# Patient Record
Sex: Male | Born: 2000 | Race: White | Hispanic: No | Marital: Single | Smoking: Former smoker
Health system: Southern US, Academic
[De-identification: ages and names within clinical notes are randomized; demographics above are authoritative.]

## PROBLEM LIST (undated history)

## (undated) HISTORY — PX: HX HERNIA REPAIR: SHX51

## (undated) HISTORY — PX: HX ACL RECONSTRUCTION: SHX115

## (undated) NOTE — Progress Notes (Signed)
 Formatting of this note might be different from the original.  Subjective   Patient ID: Jason Brown is a 24 y.o. male presenting to the Urgent Care with a chief complaint of Groin Pain (Started Tuesday, recent HSV flare-up; worried about infection).    Patient states he thinks there is infection behind the scrotum, states it is swollen and painful. Pain is radiating into the groin. Discussed care with patient, he needs to have an ultrasound, I can palpate the area, but cannot accurately diagnose an infection behind the scrotum, he requires an ultrasound and needs to go to the ER    History provided by:  Patient  Groin Pain    Objective   BP (!) 138/91 (BP Location: Right arm, Patient Position: Sitting, BP Cuff Size: Adult)   Pulse 86   Temp 36.7 C (98 F) (Oral)   Resp 18   Ht 1.778 m (5' 10)   Wt 70.3 kg (155 lb)   SpO2 99%   BMI 22.24 kg/m     Physical Exam      Assessment & Plan    Assessment & Plan        In-House Lab Results:   No results found for this or any previous visit.     In-House Imaging Reads:        Procedure Documentation:  Procedures     ED Course & MDM   MDM - Medical Decision Making: Patient declined visit.   Electronically signed by Dufm Stephane Lunger, CRNP at 04/11/2024  6:59 PM EDT

## (undated) NOTE — Progress Notes (Signed)
 Formatting of this note might be different from the original.  [employer] sent an email today saying they did issue the refund but they were unable to refund the card. They are sending a check. Attempted to call pt to inform, but no answer. Alj rma  Electronically signed by Jeoffrey Schimke, MA at 04/25/2024  1:45 PM EDT

---
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
## 2022-10-17 ENCOUNTER — Other Ambulatory Visit: Payer: Self-pay

## 2022-10-17 ENCOUNTER — Emergency Department (HOSPITAL_BASED_OUTPATIENT_CLINIC_OR_DEPARTMENT_OTHER): Payer: Worker's Comp, Other unspecified

## 2022-10-17 ENCOUNTER — Emergency Department
Admission: EM | Admit: 2022-10-17 | Discharge: 2022-10-17 | Disposition: A | Discharge status: Home / Self Care | Payer: Worker's Comp, Other unspecified | Attending: Emergency Medicine | Admitting: Emergency Medicine

## 2022-10-17 ENCOUNTER — Encounter (HOSPITAL_BASED_OUTPATIENT_CLINIC_OR_DEPARTMENT_OTHER): Payer: Self-pay

## 2022-10-17 DIAGNOSIS — S80211A Abrasion, right knee, initial encounter: Secondary | ICD-10-CM | POA: Insufficient documentation

## 2022-10-17 DIAGNOSIS — Y92149 Unspecified place in prison as the place of occurrence of the external cause: Secondary | ICD-10-CM

## 2022-10-17 DIAGNOSIS — S8390XA Sprain of unspecified site of unspecified knee, initial encounter: Secondary | ICD-10-CM | POA: Insufficient documentation

## 2022-10-17 DIAGNOSIS — Y9372 Activity, wrestling: Secondary | ICD-10-CM

## 2022-10-17 DIAGNOSIS — S80219A Abrasion, unspecified knee, initial encounter: Secondary | ICD-10-CM

## 2022-10-17 DIAGNOSIS — Y990 Civilian activity done for income or pay: Secondary | ICD-10-CM | POA: Insufficient documentation

## 2022-10-17 DIAGNOSIS — S8000XA Contusion of unspecified knee, initial encounter: Secondary | ICD-10-CM

## 2022-10-17 DIAGNOSIS — S0990XA Unspecified injury of head, initial encounter: Secondary | ICD-10-CM | POA: Insufficient documentation

## 2022-10-17 DIAGNOSIS — S8001XA Contusion of right knee, initial encounter: Secondary | ICD-10-CM | POA: Insufficient documentation

## 2022-10-17 DIAGNOSIS — S8391XA Sprain of unspecified site of right knee, initial encounter: Secondary | ICD-10-CM

## 2022-10-17 MED ORDER — IBUPROFEN 800 MG TABLET
800.0000 mg | ORAL_TABLET | Freq: Four times a day (QID) | ORAL | 0 refills | Status: AC | PRN
Start: 2022-10-17 — End: ?

## 2022-10-17 MED ORDER — BACITRACIN 500 UNIT/G OINTMENT TUBE
TOPICAL_OINTMENT | CUTANEOUS | Status: AC
Start: 2022-10-17 — End: 2022-10-17
  Filled 2022-10-17: qty 28.4

## 2022-10-17 MED ORDER — BACITRACIN 500 UNIT/G OINTMENT TUBE
TOPICAL_OINTMENT | CUTANEOUS | Status: AC
Start: 2022-10-17 — End: 2022-10-17

## 2022-10-17 MED ORDER — IBUPROFEN 800 MG TABLET
800.0000 mg | ORAL_TABLET | ORAL | Status: AC
Start: 2022-10-17 — End: 2022-10-17
  Administered 2022-10-17: 800 mg via ORAL

## 2022-10-17 MED ORDER — IBUPROFEN 800 MG TABLET
ORAL_TABLET | ORAL | Status: AC
Start: 2022-10-17 — End: 2022-10-17
  Filled 2022-10-17: qty 1
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
## 2022-10-17 NOTE — ED Nurses Note (Signed)
Right knee cleansed with hibiclens, applied adaptic, gauze to stated area. Patient tolerated well.
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
## 2022-10-17 NOTE — ED Nurses Note (Signed)
Patient discharged home with co worker. Reviewed instructions and prescriptions with patient. Questions sufficiently answered as needed. Patient verbalized understanding of instructions and prescriptions. Patient encouraged to follow up with PCP as indicated. In the event of an emergency, patient instructed to call 911 or go to the nearest emergency room. Patient ambulated off the unit.

## 2022-10-17 NOTE — ED Nurses Note (Signed)
Patient resting in bed, resp even and unlabored on RA. Denies any complaints of pain. No neurologic deficits noted. No acute distress noted.

## 2022-10-17 NOTE — ED Nurses Note (Signed)
Knee immobilizer applied right leg. Patient tolerated well.

## 2022-12-04 IMAGING — MR MRI KNEE RT W/O CONTRAST
5 series · 40 of 40 positions shown · non-contrast
Comparison: September 15, 2020

﻿EXAM:  49436   MRI KNEE RT W/O CONTRAST
INDICATION: Right knee pain, internal derangement.
TECHNIQUE: Noncontrast multiplanar, multisequence MRI was performed.

[Series 5: PD fat-sat · axial · right · 4.0mm · 0.53mm/px · z∈[-72,+59]mm · 8 of 30 slices shown (1 of 3)]
[im 1/30]
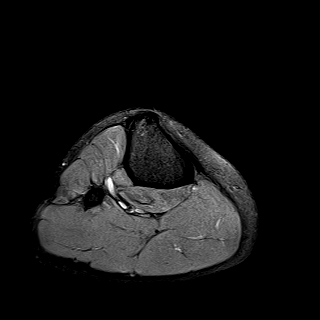
[im 5/30]
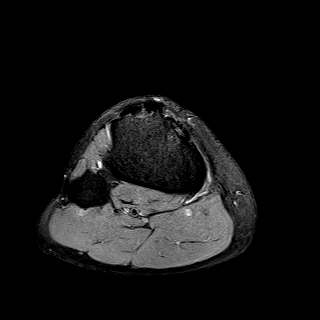
[im 9/30]
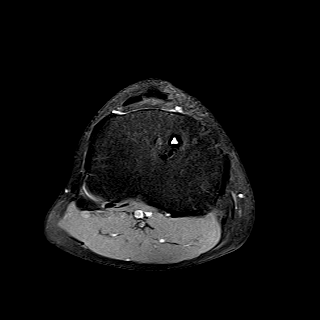
[im 13/30]
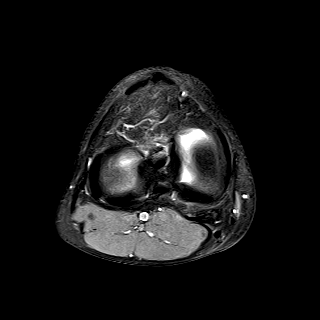
[im 17/30]
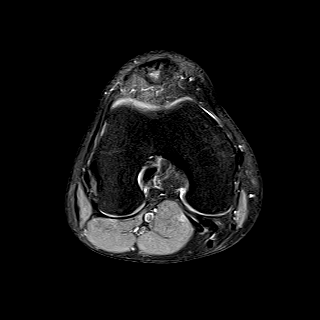
[im 21/30]
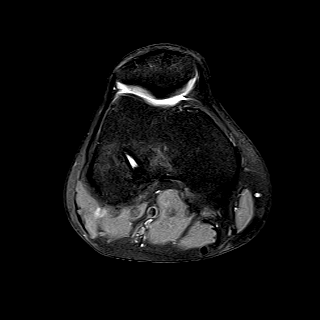
[im 25/30]
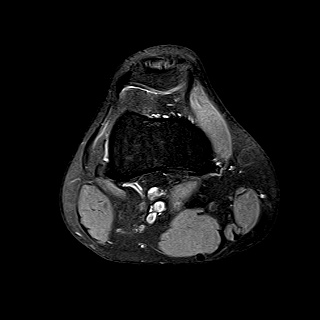
[im 30/30]
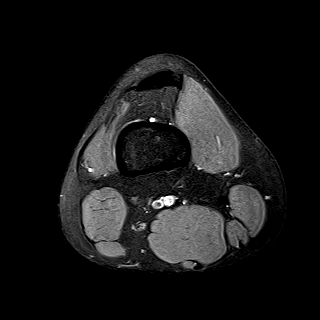

[Series 6: PD fat-sat · sagittal · right · 3.0mm · 0.47mm/px · 9 of 30 slices shown (2 of 3)]
[im 1/30]
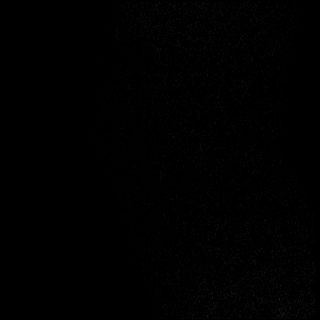
[im 4/30]
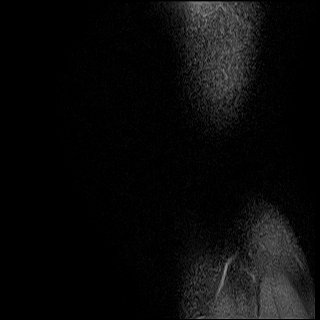
[im 8/30]
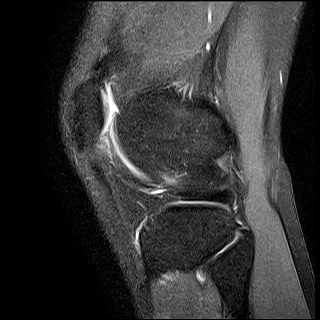
[im 11/30]
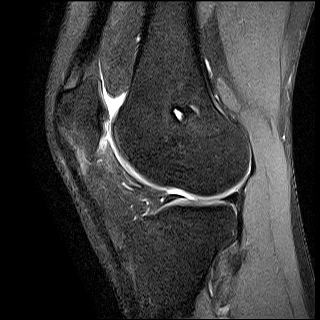
[im 15/30]
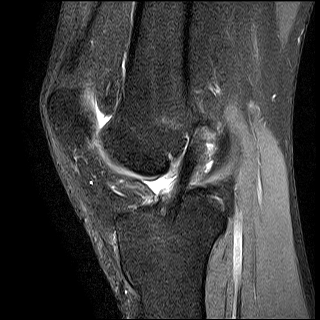
[im 19/30]
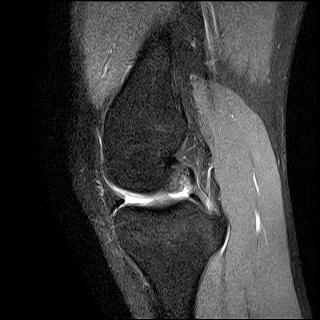
[im 22/30]
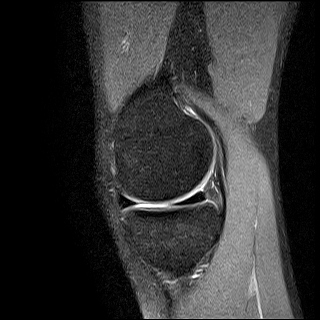
[im 26/30]
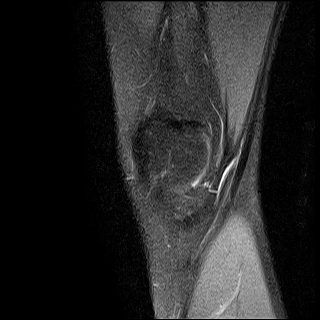
[im 30/30]
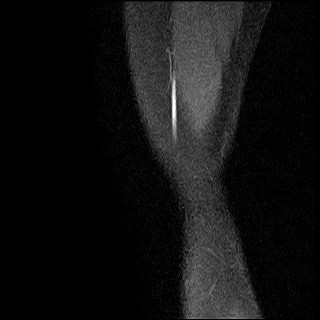

[Series 7: T1 · sagittal · right · 3.0mm · 0.39mm/px · 9 of 30 slices shown]
[im 1/30]
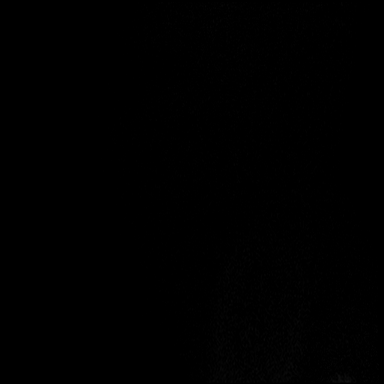
[im 4/30]
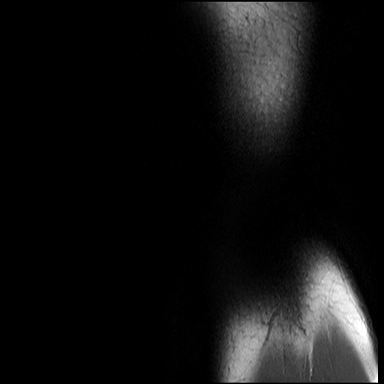
[im 8/30]
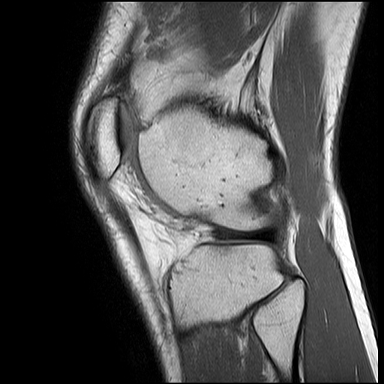
[im 11/30]
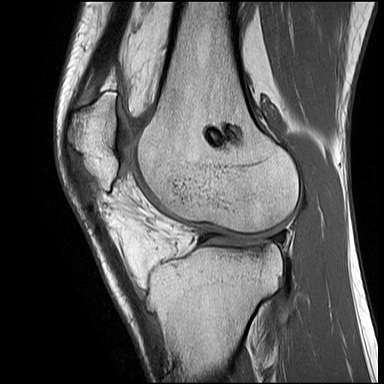
[im 15/30]
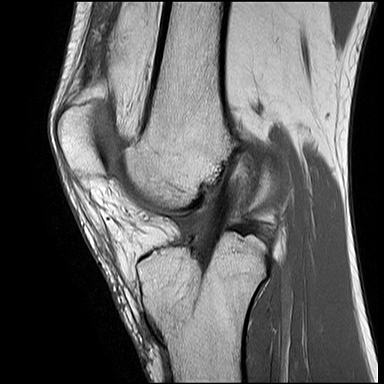
[im 19/30]
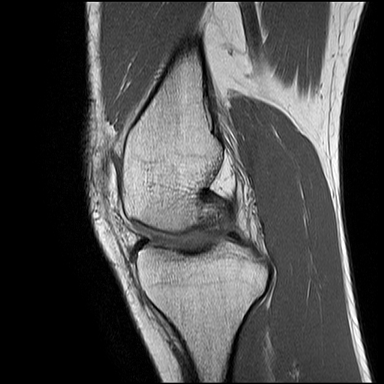
[im 22/30]
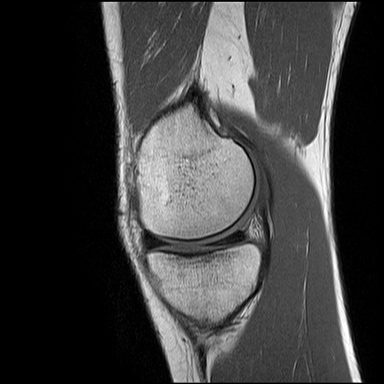
[im 26/30]
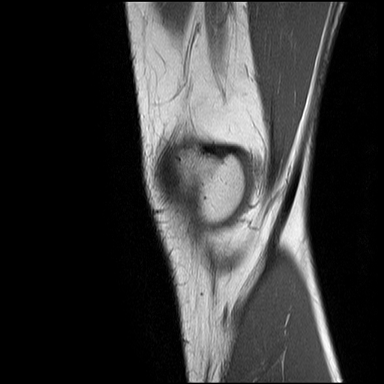
[im 30/30]
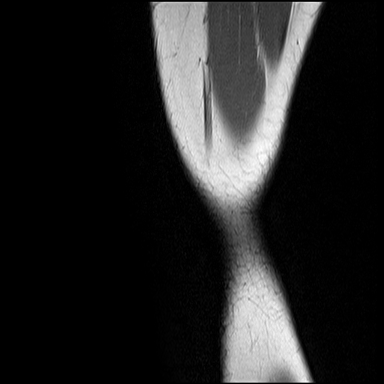

[Series 8: STIR · coronal · right · 3.5mm · 0.47mm/px · 7 of 23 slices shown]
[im 1/23]
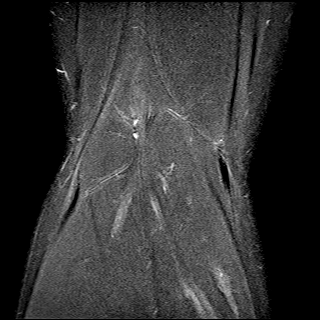
[im 4/23]
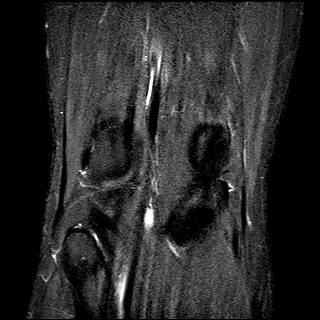
[im 8/23]
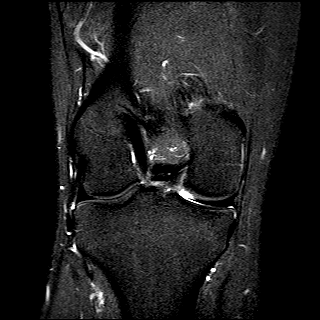
[im 12/23]
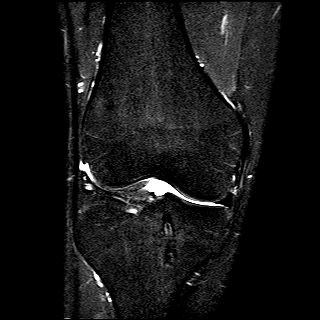
[im 15/23]
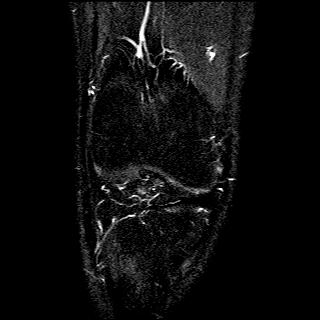
[im 19/23]
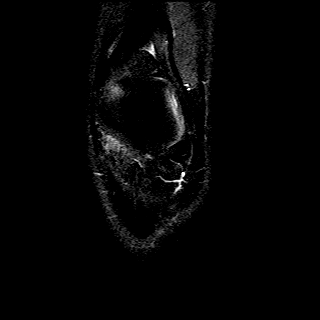
[im 23/23]
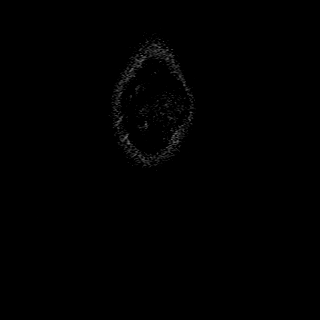

[Series 9: PD fat-sat · coronal · right · 3.5mm · 0.47mm/px · 7 of 23 slices shown (3 of 3)]
[im 1/23]
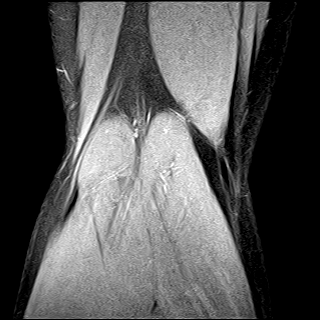
[im 4/23]
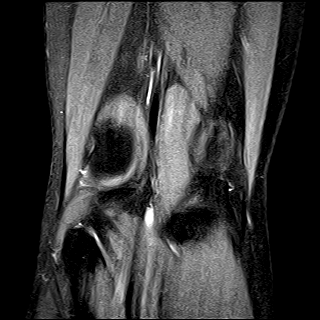
[im 8/23]
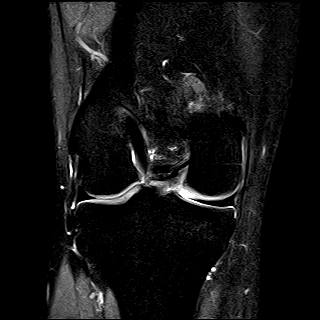
[im 12/23]
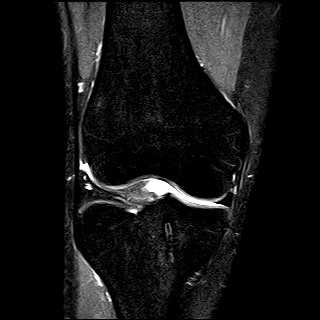
[im 15/23]
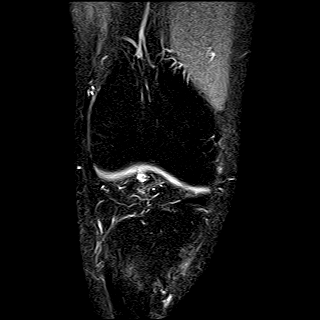
[im 19/23]
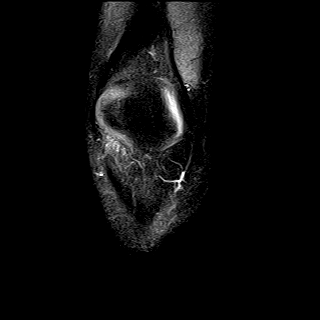
[im 23/23]
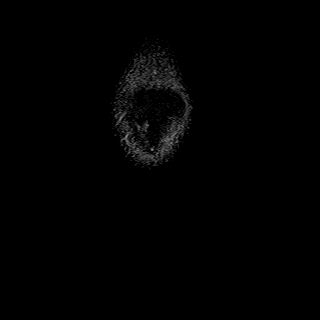

[40 of 40 positions shown; findings below may reference images not displayed]

FINDINGS: There is a small joint effusion.  Postsurgical change is noted with anterior cruciate ligament repair.  The tendon graft is intact with a taught appearance.  

There is thickening with intermediate signal intensity in the proximal patellar tendon compatible with tendinosis.  This finding is new since September 15, 2020.  Other than that, this finding is of indeterminate age.  

There is mild, grade 1 chondromalacia patella that appears unchanged.  

The posterior cruciate ligament, menisci, collateral ligaments, and quadriceps tendon appear intact.  There is no fracture, dislocation, or bone contusion.
IMPRESSION: 1. Patellar tendinosis. 

2. Intact ACL graft. 

3. No internal derangement is seen.

## 2023-03-08 ENCOUNTER — Encounter (HOSPITAL_BASED_OUTPATIENT_CLINIC_OR_DEPARTMENT_OTHER): Payer: Self-pay

## 2023-03-08 ENCOUNTER — Emergency Department
Admission: EM | Admit: 2023-03-08 | Discharge: 2023-03-08 | Disposition: A | Discharge status: Home / Self Care | Payer: Worker's Compensation | Attending: Emergency Medicine | Admitting: Emergency Medicine

## 2023-03-08 ENCOUNTER — Other Ambulatory Visit: Payer: Self-pay

## 2023-03-08 DIAGNOSIS — T50901A Poisoning by unspecified drugs, medicaments and biological substances, accidental (unintentional), initial encounter: Secondary | ICD-10-CM | POA: Insufficient documentation

## 2023-03-08 DIAGNOSIS — R42 Dizziness and giddiness: Secondary | ICD-10-CM

## 2023-03-08 DIAGNOSIS — F449 Dissociative and conversion disorder, unspecified: Secondary | ICD-10-CM

## 2023-03-08 DIAGNOSIS — Z779 Other contact with and (suspected) exposures hazardous to health: Secondary | ICD-10-CM

## 2023-03-08 DIAGNOSIS — F1910 Other psychoactive substance abuse, uncomplicated: Secondary | ICD-10-CM

## 2023-03-08 LAB — DRUG SCREEN, NO CONFIRMATION, URINE
AMPHET QL: NEGATIVE
BARB QL: NEGATIVE
BENZO QL: NEGATIVE
BUP QL: NEGATIVE
CANNAQL: NEGATIVE
COCQL: NEGATIVE
FENTANYL, RANDOM URINE: NEGATIVE
METHQL: NEGATIVE
OPIATE: NEGATIVE
OXYCODONE URINE: NEGATIVE
PCP QL: NEGATIVE

## 2023-03-08 LAB — COMPREHENSIVE METABOLIC PANEL, NON-FASTING
ALBUMIN/GLOBULIN RATIO: 1.7 — ABNORMAL HIGH (ref 0.8–1.4)
ALBUMIN: 3.9 g/dL (ref 3.4–5.0)
ALKALINE PHOSPHATASE: 60 U/L (ref 46–116)
ALT (SGPT): 20 U/L (ref <=78)
ANION GAP: 10 mmol/L (ref 4–13)
AST (SGOT): 12 U/L — ABNORMAL LOW (ref 15–37)
BILIRUBIN TOTAL: 0.6 mg/dL (ref 0.2–1.0)
BUN/CREA RATIO: 13
BUN: 13 mg/dL (ref 7–18)
CALCIUM, CORRECTED: 8.3 mg/dL
CALCIUM: 8.2 mg/dL — ABNORMAL LOW (ref 8.5–10.1)
CHLORIDE: 103 mmol/L (ref 98–107)
CO2 TOTAL: 26 mmol/L (ref 21–32)
CREATININE: 1.04 mg/dL (ref 0.70–1.30)
ESTIMATED GFR: 105 mL/min/{1.73_m2} (ref >59)
GLOBULIN: 2.3
GLUCOSE: 93 mg/dL (ref 74–106)
OSMOLALITY, CALCULATED: 277 mOsm/kg (ref 270–290)
POTASSIUM: 4.2 mmol/L (ref 3.5–5.1)
PROTEIN TOTAL: 6.2 g/dL — ABNORMAL LOW (ref 6.4–8.2)
SODIUM: 139 mmol/L (ref 136–145)

## 2023-03-08 LAB — CBC WITH DIFF
BASOPHIL #: 0.01 10*3/uL (ref 0.00–0.30)
BASOPHIL %: 0 % (ref 0–3)
EOSINOPHIL #: 0.18 10*3/uL (ref 0.00–0.80)
EOSINOPHIL %: 3 % (ref 1–8)
HCT: 46.5 % (ref 42.0–51.0)
HGB: 16.1 g/dL (ref 13.5–18.0)
LYMPHOCYTE #: 1.88 10*3/uL (ref 1.10–5.00)
LYMPHOCYTE %: 30 % (ref 25–45)
MCH: 29.6 pg (ref 27.0–32.0)
MCHC: 34.6 g/dL (ref 32.0–36.0)
MCV: 85.7 fL (ref 78.0–99.0)
MONOCYTE #: 0.36 10*3/uL (ref 0.00–1.30)
MONOCYTE %: 6 % (ref 0–12)
MPV: 7.8 fL (ref 7.4–10.4)
NEUTROPHIL #: 3.88 10*3/uL (ref 1.80–8.40)
NEUTROPHIL %: 61 % (ref 40–76)
PLATELETS: 227 10*3/uL (ref 140–440)
RBC: 5.43 10*6/uL (ref 4.20–6.00)
RDW: 15.1 % — ABNORMAL HIGH (ref 11.6–14.8)
WBC: 6.3 10*3/uL (ref 4.0–10.5)

## 2023-03-08 LAB — URINALYSIS, MACRO/MICRO
BILIRUBIN: NEGATIVE mg/dL
BLOOD: NEGATIVE mg/dL
GLUCOSE: NEGATIVE mg/dL
KETONES: NEGATIVE mg/dL
LEUKOCYTES: NEGATIVE WBCs/uL
NITRITE: NEGATIVE
PH: 7 (ref 5.0–9.0)
PROTEIN: NEGATIVE mg/dL
SPECIFIC GRAVITY: 1.023 (ref 1.002–1.030)
UROBILINOGEN: NORMAL mg/dL

## 2023-03-08 LAB — ETHANOL, SERUM/PLASMA: ETHANOL: <3 mg/dL (ref <=3)

## 2023-03-08 LAB — MAGNESIUM: MAGNESIUM: 1.8 mg/dL (ref 1.8–2.4)

## 2023-03-08 MED ORDER — SODIUM CHLORIDE 0.9 % IV BOLUS
1000.0000 mL | INJECTION | Status: AC
Start: 2023-03-08 — End: 2023-03-08
  Administered 2023-03-08: 0 mL via INTRAVENOUS
  Administered 2023-03-08: 1000 mL via INTRAVENOUS
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
## 2023-03-08 NOTE — ED Notes (Signed)
[facility], [facility] Emergency Department  Peer Recovery Coach Assessment    Initial Evaluation  Referred by:: Nurse  Location of Evaluation: Emergency Department  How many times in the last 12 months have you been to the ED?: 2  Have you ever served or are you currently serving in the Armed Forces?: No             Substance Use History  Patient current substance use status: False Positive. 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 Patietn does not use any substances.         Need for additional follow-up?: No       Haynes Bast, Peer Recovery Coach 03/08/2023 14:00
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
## 2023-03-08 NOTE — ED Nurses Note (Signed)
Patient discharged home with family.  AVS reviewed with patient/care giver.  A written copy of the AVS and discharge instructions was given to the patient/care giver.  Questions sufficiently answered as needed.  Patient/care giver encouraged to follow up with PCP as indicated.  In the event of an emergency, patient/care giver instructed to call 911 or go to the nearest emergency room. Pt verbalized understanding of discharge instructions. Denies any questions or concerns at this time. Respirations even and unlabored w/ no s/s of acute distress noted at this time. Pt reports he is feeling better at this time.

## 2023-03-09 DIAGNOSIS — I499 Cardiac arrhythmia, unspecified: Secondary | ICD-10-CM

## 2023-03-09 LAB — ECG 12 LEAD
Atrial Rate: 64 {beats}/min
Calculated P Axis: 64 degrees
Calculated R Axis: 78 degrees
Calculated T Axis: 56 degrees
PR Interval: 132 ms
QRS Duration: 98 ms
QT Interval: 388 ms
QTC Calculation: 400 ms
Ventricular rate: 64 {beats}/min

## 2023-08-04 ENCOUNTER — Other Ambulatory Visit: Payer: Self-pay

## 2023-08-04 ENCOUNTER — Encounter (HOSPITAL_COMMUNITY): Payer: Self-pay

## 2023-08-04 ENCOUNTER — Emergency Department
Admission: EM | Admit: 2023-08-04 | Discharge: 2023-08-04 | Disposition: A | Discharge status: Home / Self Care | Attending: Physician Assistant | Admitting: Physician Assistant

## 2023-08-04 DIAGNOSIS — N5089 Other specified disorders of the male genital organs: Secondary | ICD-10-CM | POA: Insufficient documentation

## 2023-08-04 DIAGNOSIS — N509 Disorder of male genital organs, unspecified: Secondary | ICD-10-CM

## 2023-08-04 MED ORDER — VALACYCLOVIR 1 GRAM TABLET
1000.0000 mg | ORAL_TABLET | Freq: Two times a day (BID) | ORAL | 0 refills | Status: AC
Start: 2023-08-04 — End: 2023-08-14

## 2023-08-04 NOTE — ED Triage Notes (Signed)
Rash to genital area x 1 week not getting better.

## 2023-08-04 NOTE — ED Nurses Note (Signed)
 Patient discharged home with family. AVS reviewed with patient. A written copy of the AVS and discharge instructions were given to the patient. Questions sufficiently answered as needed. Patient encouraged to follow up with PCP as indicated. In the event of an emergency, patient instructed to call 911 or go to the nearest emergency room. Patient left department via ambulation.

## 2023-08-04 NOTE — ED Provider Notes (Signed)
[facility] - Emergency Department  ED Primary Provider Note  History of Present Illness   Chief Complaint   Patient presents with    Rash     Minas Bonser is a 23 y.o. male who had concerns including Rash.  Arrival: The patient arrived by Car    Patient is a 23 year old male no significant past medical history presents emergency department with complaints of genital lesions.  States it has been present for the past week.  He states they itchy at times and hurt it others.  He states initially there were vesicles and now they are some scabs.  He denies any known exposure denies previous episodes of this.      History Reviewed This Encounter: Medical History  Surgical History  Family History  Social History    Physical Exam   ED Triage Vitals [08/04/23 2205]   BP (Non-Invasive) (!) 156/91   Heart Rate 86   Respiratory Rate 16   Temperature 36.3 C (97.4 F)   SpO2 98 %   Weight 67.6 kg (149 lb)   Height 1.778 m (5\' 10" )     Physical Exam  HENT:      Head: Normocephalic.   Genitourinary:     Comments: Vesicular lesions about the penis scattered eschars around the pubis mons  Neurological:      Mental Status: He is alert.       Patient Data   Labs Ordered/Reviewed - No data to display  No orders to display     Medical Decision Making        Medical Decision Making  Viral swabs obtained.  We will start patient on valacyclovir.  Recommended follow up primary care discussed spread of disease    Amount and/or Complexity of Data Reviewed  Labs: ordered.                   Clinical Impression   Genital lesion, male (Primary)       Disposition: Discharged

## 2023-08-04 NOTE — Discharge Instructions (Addendum)
Viral swab is pending.  Results should be available on your MyChart in the next 3-4 days.  You can also contact medical records results.  Follow up with your primary care.  A prescription for antiviral was written take this as prescribed.  Avoid sexual activity until the lesions have disappeared any cleared by your primary care

## 2023-08-07 LAB — HERPES SIMPLEX VIRUS (HSV1/HSV2), PCR, CSF OR SWAB
HERPES SIMPLEX VIRUS 1: POSITIVE — AB
HERPES SIMPLEX VIRUS 2: NEGATIVE

## 2023-10-23 ENCOUNTER — Emergency Department: Admission: EM | Admit: 2023-10-23 | Discharge: 2023-10-23 | Disposition: A | Discharge status: Home / Self Care

## 2023-10-23 ENCOUNTER — Encounter (HOSPITAL_COMMUNITY): Payer: Self-pay

## 2023-10-23 ENCOUNTER — Other Ambulatory Visit (HOSPITAL_COMMUNITY): Payer: Self-pay

## 2023-10-23 ENCOUNTER — Other Ambulatory Visit: Payer: Self-pay

## 2023-10-23 DIAGNOSIS — S50811A Abrasion of right forearm, initial encounter: Secondary | ICD-10-CM | POA: Insufficient documentation

## 2023-10-23 DIAGNOSIS — S61250A Open bite of right index finger without damage to nail, initial encounter: Secondary | ICD-10-CM | POA: Insufficient documentation

## 2023-10-23 DIAGNOSIS — W5501XA Bitten by cat, initial encounter: Secondary | ICD-10-CM | POA: Insufficient documentation

## 2023-10-23 DIAGNOSIS — Z203 Contact with and (suspected) exposure to rabies: Secondary | ICD-10-CM | POA: Insufficient documentation

## 2023-10-23 DIAGNOSIS — W5503XA Scratched by cat, initial encounter: Secondary | ICD-10-CM | POA: Insufficient documentation

## 2023-10-23 DIAGNOSIS — S60511A Abrasion of right hand, initial encounter: Secondary | ICD-10-CM | POA: Insufficient documentation

## 2023-10-23 DIAGNOSIS — Z23 Encounter for immunization: Secondary | ICD-10-CM | POA: Insufficient documentation

## 2023-10-23 DIAGNOSIS — Z2914 Encounter for prophylactic rabies immune globin: Secondary | ICD-10-CM | POA: Insufficient documentation

## 2023-10-23 DIAGNOSIS — S60512A Abrasion of left hand, initial encounter: Secondary | ICD-10-CM | POA: Insufficient documentation

## 2023-10-23 DIAGNOSIS — S50812A Abrasion of left forearm, initial encounter: Secondary | ICD-10-CM | POA: Insufficient documentation

## 2023-10-23 MED ORDER — RABIES IMMUNE GLOBULIN (PF) 300 UNIT/ML INTRAMUSCULAR SOLUTION
INTRAMUSCULAR | Status: AC
Start: 2023-10-23 — End: 2023-10-23
  Filled 2023-10-23: qty 4

## 2023-10-23 MED ORDER — RABIES VACCINE,HUMAN DIPLOID (PF) 2.5 UNIT INTRAMUSCULAR SOLUTION
2.5000 [IU] | Freq: Once | INTRAMUSCULAR | Status: DC
Start: 2023-10-23 — End: 2023-10-23

## 2023-10-23 MED ORDER — BACITRACIN ZINC 500 UNIT-POLYMYXIN B 10,000 UNIT/GRAM TOPICAL OINTMENT
TOPICAL_OINTMENT | Freq: Two times a day (BID) | CUTANEOUS | Status: DC
Start: 2023-10-23 — End: 2023-10-23
  Filled 2023-10-23: qty 14.2

## 2023-10-23 MED ORDER — DOXYCYCLINE HYCLATE 100 MG CAPSULE
100.0000 mg | ORAL_CAPSULE | Freq: Two times a day (BID) | ORAL | 0 refills | Status: AC
Start: 2023-10-23 — End: 2023-10-30

## 2023-10-23 MED ORDER — RABIES IMMUNE GLOBULIN (PF) 300 UNIT/ML INTRAMUSCULAR SOLUTION
INTRAMUSCULAR | Status: AC
Start: 2023-10-23 — End: 2023-10-23
  Filled 2023-10-23: qty 5

## 2023-10-23 MED ORDER — RABIES IMMUNE GLOBULIN (PF) 300 UNIT/ML INTRAMUSCULAR SOLUTION
20.0000 [IU]/kg | Freq: Once | INTRAMUSCULAR | Status: AC
Start: 2023-10-23 — End: 2023-10-23
  Administered 2023-10-23: 1320 [IU]

## 2023-10-23 MED ORDER — METRONIDAZOLE 500 MG TABLET
500.0000 mg | ORAL_TABLET | Freq: Three times a day (TID) | ORAL | 0 refills | Status: AC
Start: 2023-10-23 — End: 2023-10-30

## 2023-10-23 MED ORDER — RABIES VACCINE,HUMAN DIPLOID (PF) 2.5 UNIT INTRAMUSCULAR SOLUTION
INTRAMUSCULAR | Status: AC
Start: 2023-10-23 — End: 2023-10-23
  Filled 2023-10-23: qty 1

## 2023-10-23 MED ORDER — LORAZEPAM 2 MG/ML INJECTION WRAPPER
0.2500 mg | INTRAMUSCULAR | Status: DC
Start: 2023-10-23 — End: 2023-10-23

## 2023-10-23 MED ORDER — RABIES VACCINE,HUMAN DIPLOID (PF) 2.5 UNIT INTRAMUSCULAR SOLUTION
2.5000 [IU] | Freq: Once | INTRAMUSCULAR | Status: AC
Start: 2023-10-23 — End: 2023-10-23
  Administered 2023-10-23: 2.5 [IU] via INTRAMUSCULAR

## 2023-10-23 NOTE — ED Nurses Note (Signed)
Patient discharged home.  AVS reviewed with patient.  A written copy of the AVS and discharge instructions was given to the patient.  Questions sufficiently answered as needed.  Patient encouraged to follow up with PCP as indicated.  In the event of an emergency, patient/care giver instructed to call 911 or go to the nearest emergency room.. Pt left department ambulatory.

## 2023-10-23 NOTE — ED Provider Notes (Cosign Needed)
  [facility]  ED Primary Provider Note  Patient Name: Jason Brown  Patient Age: 23 y.o.  Date of Birth: 2000-09-11    Chief Complaint: Cat bite and Cat scratch        History of Present Illness       Jason Brown is a 23 y.o. male who had concerns including Cat bite and Cat scratch.  23 year old male patient presents to the emergency department with multiple scratches on bilateral hands and forearms from a cat that appeared on his front reports this morning.  Cat went into cage, however cat was able to bite the patient, on the left index finger.  Patient is and familiar with the cat that showed up on his ports this morning.  Patient reports the cat is very aggressive in nature.  Patient also reports that the cat is still in a cage at his home.            Review of Systems     No other overt Review of Systems are noted to be positive except noted in the HPI.          Physical Exam   ED Triage Vitals [10/23/23 0712]   BP (Non-Invasive) 139/82   Heart Rate 86   Respiratory Rate 18   Temperature 36.8 C (98.2 F)   SpO2 99 %   Weight 65.8 kg (145 lb)   Height 1.778 m (5\' 10" )         Nursing notes reviewed for what could be assessed. Past Medical, Surgical, and Social history reviewed for what has been completed.     Constitutional: NAD. Well-Developed. Well Nourished.  Head: Normocephalic, atraumatic.  Mouth/Throat:  Pink and moist.  Eyes: EOM grossly intact, conjunctiva normal Pupils 4 mm PERRL bilateral.  Neck: Supple, No JVD, Trachea Midline  Cardiovascular: Regular Rate and Rhythm, extremities well perfused.  Pulmonary/Chest: No respiratory distress.  Breath sounds are clear and equal bilateral..  Abdominal: Soft, non-tender, non-distended. Non peritoneal, no rebound, no guarding.  MSK: N. Moving all extremities with equal strength and purpose   Skin: Warm, dry, and warm dry normal.  Patient has small penetrating type wounds to right index finger, at the base of the  nailbed.  Bleeding self control.  Patient has multiple scratches on dorsal aspect of the hands.  Patient also has multiple scratches bilateral forearms.  Neuro: Appropriate, CN II-XII grossly intact. Gait normal  Psych: Pleasant         No orders to display       Medical Decision Making       MDM Narrative:  Wound cleansing due to cat bite to the distal portion of right index finger.  Prophylactic antibiotic treatment.  After discussion with patient, patient has elected to start rabies vaccination series.    Patient will contact animal control for guidance on observation of cat.    [place] [employer] animal encounter form completed.   Patient has been in contact with [place] Sheriff[employer] Department, in regards to observation of animal and care for the animal during that process.  [employer] Department in animal control is supposed to call patient with additional information.    Inpatient room nursing staff has soap cleaned bite areas and also scratches on forearms and dorsal aspects of the hands.    Discussion with IV infusion center, remaining vaccinations will be scheduled by IV infusion center.           Differential  includes but not limited to:  Wound due to cat bite.    Due to aggressive nature of cat patient has elected to start rabies series including immunoglobulin.          Follow-Up Discussion:  [facility] Department animal encounter monitoring form completed.    Please see documentation above for specific labs and radiology.    Decision for and Complexity of Risk in the ED encounter:  I ordered prescription medications requiring authorization in the ED or at discharge.  Discussion of care with a provider outside of the Emergency Department: Infusion center to get remaining vaccinations                         Following the history, physical exam, and ED workup, the patient was deemed stable and suitable for discharge. The patient/caregiver was advised to return to the ED  for any new or worsening symptoms. Discharge medications, and follow-up instructions were discussed with the patient/caregiver in detail, who verbalizes understanding. The patient/caregiver is in agreement and is comfortable with the plan of care.    Disposition: Discharged         Current Discharge Medication List        START taking these medications.        Details   doxycycline  hyclate 100 mg Capsule  Commonly known as: VIBRAMYCIN    100 mg, Oral, 2 TIMES DAILY  Qty: 14 Capsule  Refills: 0     metroNIDAZOLE  500 mg Tablet  Commonly known as: FLAGYL    500 mg, Oral, 3 TIMES DAILY  Qty: 21 Tablet  Refills: 0            CONTINUE these medications - NO CHANGES were made during your visit.        Details   fluticasone propionate 50 mcg/actuation Spray, Suspension  Commonly known as: FLONASE   1 Spray, Each Nostril, Daily  Refills: 0     Ibuprofen  800 mg Tablet  Commonly known as: MOTRIN    800 mg, Oral, 4 TIMES DAILY PRN  Qty: 30 Tablet  Refills: 0     loratadine 10 mg Tablet  Commonly known as: CLARITIN   10 mg, Oral, Daily  Refills: 0            Follow up:   No follow-up provider specified.             "Risk" as noted in the medical decision-making is in reference to potential morbidity/mortality of management based upon previously established billing guidelines.    Based upon the clinical setting, the likely diagnosis/impression include:        Current Discharge Medication List                This note was partially created using voice recognition software and is inherently subject to errors including those of syntax and "sound alike " substitutions which may escape proof reading. In such instances, original meaning may be extrapolated by contextual derivation.

## 2023-10-23 NOTE — Discharge Instructions (Addendum)
 Clean bite wound and scratches with soap and water.  Returned to the emergency department into 3 days for wound check.  Return to emergency department for increased swelling and redness to the right index finger.    Return to for remainder of rabies vaccines on day 3, day 7 and day 14.    Return 4/25,2025, 10/30/2023, Nov 06, 2023.
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
## 2023-10-26 ENCOUNTER — Ambulatory Visit
Admission: RE | Admit: 2023-10-26 | Discharge: 2023-10-26 | Disposition: A | Discharge status: Home / Self Care | Payer: Self-pay | Source: Ambulatory Visit

## 2023-10-26 ENCOUNTER — Other Ambulatory Visit: Payer: Self-pay

## 2023-10-26 VITALS — BP 121/77 | HR 78 | Temp 96.9°F | Resp 16

## 2023-10-26 DIAGNOSIS — Z23 Encounter for immunization: Secondary | ICD-10-CM | POA: Insufficient documentation

## 2023-10-26 DIAGNOSIS — W5501XA Bitten by cat, initial encounter: Secondary | ICD-10-CM | POA: Insufficient documentation

## 2023-10-26 MED ORDER — RABIES VACCINE,HUMAN DIPLOID (PF) 2.5 UNIT INTRAMUSCULAR SOLUTION
2.5000 [IU] | Freq: Once | INTRAMUSCULAR | Status: AC
Start: 2023-10-26 — End: 2023-10-26
  Administered 2023-10-26: 2.5 [IU] via INTRAMUSCULAR
  Filled 2023-10-26: qty 1

## 2023-10-26 NOTE — Nurses Notes (Signed)
 Pt was given Rabies vaccine  in left deltoid without any adverse reactions or complaints. Took Band-Aid off right index finger and observed for signs of infection. None seen. Healing well. Band-Aid applied. DC to home. Will return next week for next injection.

## 2023-10-30 ENCOUNTER — Other Ambulatory Visit: Payer: Self-pay

## 2023-10-30 ENCOUNTER — Ambulatory Visit
Admission: RE | Admit: 2023-10-30 | Discharge: 2023-10-30 | Disposition: A | Discharge status: Home / Self Care | Payer: Self-pay | Source: Ambulatory Visit

## 2023-10-30 VITALS — BP 133/64 | HR 86 | Temp 97.0°F | Resp 16

## 2023-10-30 DIAGNOSIS — Z23 Encounter for immunization: Secondary | ICD-10-CM | POA: Insufficient documentation

## 2023-10-30 DIAGNOSIS — W5501XD Bitten by cat, subsequent encounter: Secondary | ICD-10-CM | POA: Insufficient documentation

## 2023-10-30 MED ORDER — RABIES VACCINE,HUMAN DIPLOID (PF) 2.5 UNIT INTRAMUSCULAR SOLUTION
2.5000 [IU] | Freq: Once | INTRAMUSCULAR | Status: AC
Start: 2023-10-30 — End: 2023-10-30
  Administered 2023-10-30: 2.5 [IU] via INTRAMUSCULAR
  Filled 2023-10-30: qty 1

## 2023-10-30 NOTE — Nurses Notes (Signed)
 Pt received Rabies vaccine  into left deltoid per pt request. States this arm is already sore from previous injection and does not want it in right deltoid. No adverse reactions. DC home.

## 2023-10-30 NOTE — Nurses Notes (Signed)
 Took Band-Aid off right index finger where he was bitten by cat. Wound healing very well. No redness, swelling or drainage.

## 2023-11-06 ENCOUNTER — Ambulatory Visit
Admission: RE | Admit: 2023-11-06 | Discharge: 2023-11-06 | Disposition: A | Discharge status: Home / Self Care | Payer: Self-pay | Source: Ambulatory Visit

## 2023-11-06 ENCOUNTER — Other Ambulatory Visit: Payer: Self-pay

## 2023-11-06 VITALS — BP 108/79 | HR 60 | Temp 96.8°F | Resp 16

## 2023-11-06 DIAGNOSIS — Z23 Encounter for immunization: Secondary | ICD-10-CM | POA: Insufficient documentation

## 2023-11-06 DIAGNOSIS — Z203 Contact with and (suspected) exposure to rabies: Secondary | ICD-10-CM | POA: Insufficient documentation

## 2023-11-06 DIAGNOSIS — W5501XD Bitten by cat, subsequent encounter: Secondary | ICD-10-CM | POA: Insufficient documentation

## 2023-11-06 MED ORDER — RABIES VACCINE,HUMAN DIPLOID (PF) 2.5 UNIT INTRAMUSCULAR SOLUTION
2.5000 [IU] | Freq: Once | INTRAMUSCULAR | Status: AC
Start: 2023-11-06 — End: 2023-11-06
  Administered 2023-11-06: 2.5 [IU] via INTRAMUSCULAR
  Filled 2023-11-06: qty 1

## 2023-11-06 NOTE — Nurses Notes (Signed)
 Pts day 14 Rabies vaccine  given in left deltoid per pt request. No adverse reactions or complaints. DC to home.

## 2024-01-05 ENCOUNTER — Other Ambulatory Visit: Payer: Self-pay

## 2024-01-05 ENCOUNTER — Emergency Department
Admission: EM | Admit: 2024-01-05 | Discharge: 2024-01-05 | Disposition: A | Discharge status: Home / Self Care | Attending: NURSE PRACTITIONER | Admitting: NURSE PRACTITIONER

## 2024-01-05 DIAGNOSIS — S0501XA Injury of conjunctiva and corneal abrasion without foreign body, right eye, initial encounter: Secondary | ICD-10-CM | POA: Insufficient documentation

## 2024-01-05 DIAGNOSIS — W208XXA Other cause of strike by thrown, projected or falling object, initial encounter: Secondary | ICD-10-CM | POA: Insufficient documentation

## 2024-01-05 MED ORDER — FLUORESCEIN 1 MG EYE STRIPS
1.0000 | ORAL_STRIP | OPHTHALMIC | Status: AC
Start: 2024-01-05 — End: 2024-01-05
  Administered 2024-01-05: 1 via OPHTHALMIC

## 2024-01-05 MED ORDER — CIPROFLOXACIN 0.3 % EYE DROPS
1.0000 [drp] | Freq: Once | OPHTHALMIC | Status: AC
Start: 2024-01-05 — End: 2024-01-05
  Administered 2024-01-05: 1 [drp] via OPHTHALMIC

## 2024-01-05 MED ORDER — TETRACAINE 0.5 % EYE DROPS
1.0000 [drp] | OPHTHALMIC | Status: AC
Start: 2024-01-05 — End: 2024-01-05
  Administered 2024-01-05: 1 [drp] via OPHTHALMIC

## 2024-01-05 MED ORDER — FLUORESCEIN 1 MG EYE STRIPS
ORAL_STRIP | OPHTHALMIC | Status: AC
Start: 2024-01-05 — End: 2024-01-05
  Filled 2024-01-05: qty 1

## 2024-01-05 MED ORDER — TETRACAINE 0.5 % EYE DROPS
OPHTHALMIC | Status: AC
Start: 2024-01-05 — End: 2024-01-05
  Filled 2024-01-05: qty 100

## 2024-01-05 MED ORDER — CIPROFLOXACIN 0.3 % EYE DROPS
1.0000 [drp] | Freq: Four times a day (QID) | OPHTHALMIC | 0 refills | Status: AC
Start: 2024-01-05 — End: 2024-01-10

## 2024-01-05 MED ORDER — CIPROFLOXACIN 0.3 % EYE DROPS
OPHTHALMIC | Status: AC
Start: 2024-01-05 — End: 2024-01-05
  Filled 2024-01-05: qty 5
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
## 2024-01-05 NOTE — Discharge Instructions (Addendum)
 Please use antibiotics as directed, please complete the total course of antibiotics as to not get a worse and rebound infection.  It is okay to wear your eye glasses however do not wear contacts until completely healed and completed antibiotics    Should you have any continuation of problems please follow up with your GI doctor    Please return to the ER for emergent conditions

## 2024-01-05 NOTE — ED Nurses Note (Signed)

## 2024-01-05 NOTE — ED Provider Notes (Signed)
 [facility] - Emergency Department  ED Primary Note  History of Present Illness   Jason Brown is a 23 y.o. male who had concerns including Eye Pain. ***    Past Medical History: No past medical history on file.    Past Surgical History:   Past Surgical History:   Procedure Laterality Date    Hx acl reconstruction Right     Hx hernia repair         Social History: Social History[1]     Family History:  No family history on file.  Oncology History    No history exists.        Physical Exam   ED Triage Vitals [01/05/24 1736]   BP (Non-Invasive) 136/89   Heart Rate 81   Respiratory Rate 18   Temperature 36.6 C (97.8 F)   SpO2 99 %   Weight 68 kg (150 lb)   Height 1.753 m (5' 9)     {ED Physical Exam:54666}      [place] Pain Rating Scale     On a scale of 0-10, during the past 24 hours, pain has interfered with you usual activity:       On a scale of 0-10, during the past 24 hours, pain has interfered with your sleep:      On a scale of 0-10, during the past 24 hours, pain has affected your mood:       On a scale of 0-10, during the past 24 hours, pain has contributed to your stress:       On a scale of 0-10, what is your overall pain Rating: 6        Medications Prior to Admission       Prescriptions    fluticasone propionate (FLONASE) 50 mcg/actuation Nasal Spray, Suspension    Administer 1 Spray into each nostril Once a day    Ibuprofen  (MOTRIN ) 800 mg Oral Tablet    Take 1 Tablet (800 mg total) by mouth Four times a day as needed for Pain    loratadine (CLARITIN) 10 mg Oral Tablet    Take 1 Tablet (10 mg total) by mouth Once a day            Allergies: Allergies[2]    Above history reviewed with patient.  Allergies, medication list, reviewed.     Patient Data   Labs Ordered/Reviewed - No data to display  No orders to display     Medical Decision Making   {?? Select the MDM button at the top of the note composer window and be sure to fill out the MDM SmartBlock in Notewriter to the ozqu:61845}      Medical Decision Making  Risk  Prescription drug management.                Medications Ordered/Administered in the ED   ciprofloxacin  (CILOXIN) 0.3% ophthalmic solution (has no administration in time range)   tetracaine  (PONTOCAINE) 0.5 % ophthalmic solution (1 Drop Both Eyes Given 01/05/24 1930)   fluorescein  (FLUOR-I-STRIP) ophthalmic strip (1 Strip Both Eyes Given 01/05/24 1930)     Clinical Impression   Abrasion of right cornea, initial encounter (Primary)       Disposition: Discharged  {?Quick Link  Level of Service Calculator:123}  {Critical Care Time (Optional):37527}     {?? Remember to refresh your note prior to signing. Use Control + F11 or click the refresh button at the bottom of the note:38154}       [  1]   Social History  Tobacco Use    Smoking status: Former     Types: Cigarettes    Smokeless tobacco: Never   Vaping Use    Vaping status: Former   Substance Use Topics    Alcohol use: Yes     Comment: occasionaly    Drug use: Never   [2]   Allergies  Allergen Reactions    Sudafed [Pseudoephedrine Hcl]  Other Adverse Reaction (Add comment)     Heart rate drops to the 40's    Penicillin Swelling

## 2024-01-09 ENCOUNTER — Encounter (HOSPITAL_BASED_OUTPATIENT_CLINIC_OR_DEPARTMENT_OTHER): Payer: Self-pay

## 2024-01-09 ENCOUNTER — Emergency Department (HOSPITAL_BASED_OUTPATIENT_CLINIC_OR_DEPARTMENT_OTHER): Payer: Worker's Compensation

## 2024-01-09 ENCOUNTER — Emergency Department
Admission: EM | Admit: 2024-01-09 | Discharge: 2024-01-09 | Disposition: A | Discharge status: Home / Self Care | Payer: Worker's Compensation | Attending: Family | Admitting: Family

## 2024-01-09 ENCOUNTER — Other Ambulatory Visit: Payer: Self-pay

## 2024-01-09 ENCOUNTER — Encounter (HOSPITAL_COMMUNITY): Payer: Self-pay

## 2024-01-09 DIAGNOSIS — S80211A Abrasion, right knee, initial encounter: Secondary | ICD-10-CM | POA: Insufficient documentation

## 2024-01-09 DIAGNOSIS — Z7721 Contact with and (suspected) exposure to potentially hazardous body fluids: Secondary | ICD-10-CM | POA: Insufficient documentation

## 2024-01-09 DIAGNOSIS — S0990XA Unspecified injury of head, initial encounter: Secondary | ICD-10-CM | POA: Insufficient documentation

## 2024-01-09 DIAGNOSIS — Z578 Occupational exposure to other risk factors: Secondary | ICD-10-CM

## 2024-01-09 DIAGNOSIS — R0781 Pleurodynia: Secondary | ICD-10-CM

## 2024-01-09 DIAGNOSIS — Z23 Encounter for immunization: Secondary | ICD-10-CM | POA: Insufficient documentation

## 2024-01-09 DIAGNOSIS — Y92149 Unspecified place in prison as the place of occurrence of the external cause: Secondary | ICD-10-CM | POA: Insufficient documentation

## 2024-01-09 DIAGNOSIS — Y990 Civilian activity done for income or pay: Secondary | ICD-10-CM | POA: Insufficient documentation

## 2024-01-09 DIAGNOSIS — S20219A Contusion of unspecified front wall of thorax, initial encounter: Secondary | ICD-10-CM | POA: Insufficient documentation

## 2024-01-09 MED ORDER — IBUPROFEN 800 MG TABLET
800.0000 mg | ORAL_TABLET | ORAL | Status: AC
Start: 2024-01-09 — End: 2024-01-09
  Administered 2024-01-09: 800 mg via ORAL

## 2024-01-09 MED ORDER — DIPHTH,PERTUSSIS(ACEL),TETANUS 2.5 LF UNIT-8 MCG-5 LF/0.5ML IM SYRINGE
INJECTION | INTRAMUSCULAR | Status: AC
Start: 2024-01-09 — End: 2024-01-09
  Filled 2024-01-09: qty 0.5

## 2024-01-09 MED ORDER — IBUPROFEN 600 MG TABLET
600.0000 mg | ORAL_TABLET | Freq: Four times a day (QID) | ORAL | 0 refills | Status: AC | PRN
Start: 2024-01-09 — End: ?

## 2024-01-09 MED ORDER — DIPHTH,PERTUSSIS(ACEL),TETANUS 2.5 LF UNIT-8 MCG-5 LF/0.5ML IM SYRINGE
0.5000 mL | INJECTION | INTRAMUSCULAR | Status: AC
Start: 2024-01-09 — End: 2024-01-09
  Administered 2024-01-09: 0.5 mL via INTRAMUSCULAR

## 2024-01-09 MED ORDER — IBUPROFEN 800 MG TABLET
ORAL_TABLET | ORAL | Status: AC
Start: 2024-01-09 — End: 2024-01-09
  Filled 2024-01-09: qty 1
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
## 2024-01-09 NOTE — Discharge Instructions (Addendum)
 The chances of topical blood exposure causing a diease such as HIV or hepatitis is fortunately very slim. Have everything repeated in 6 weeks. Return if worse pain or any concerns.

## 2024-01-11 LAB — HEPATITIS B CORE IGM, AB: HBV CORE IGM ANTIBODY QUALITATIVE: NEGATIVE

## 2024-01-11 LAB — HEPATITIS B SURFACE ANTIGEN: HBV SURFACE ANTIGEN QUALITATIVE: NEGATIVE

## 2024-01-11 LAB — HEPATITIS C ANTIBODY SCREEN WITH REFLEX TO HCV PCR: HCV ANTIBODY QUALITATIVE: NEGATIVE

## 2024-01-11 LAB — HIV1/HIV2 SCREEN, COMBINED ANTIGEN AND ANTIBODY: HIV SCREEN, COMBINED ANTIGEN & ANTIBODY: NEGATIVE

## 2024-01-11 LAB — HEPATITIS A (HAV) IGM ANTIBODY: HAV IGM: NEGATIVE

## 2024-01-24 ENCOUNTER — Encounter (HOSPITAL_COMMUNITY): Payer: Self-pay

## 2024-02-04 ENCOUNTER — Encounter (HOSPITAL_COMMUNITY): Payer: Self-pay

## 2024-04-11 ENCOUNTER — Emergency Department (HOSPITAL_COMMUNITY)

## 2024-04-11 ENCOUNTER — Encounter (HOSPITAL_COMMUNITY): Payer: Self-pay

## 2024-04-11 ENCOUNTER — Other Ambulatory Visit: Payer: Self-pay

## 2024-04-11 ENCOUNTER — Emergency Department: Admission: EM | Admit: 2024-04-11 | Discharge: 2024-04-11 | Disposition: A | Discharge status: Home / Self Care

## 2024-04-11 DIAGNOSIS — Z8719 Personal history of other diseases of the digestive system: Secondary | ICD-10-CM | POA: Insufficient documentation

## 2024-04-11 DIAGNOSIS — R1032 Left lower quadrant pain: Secondary | ICD-10-CM

## 2024-04-11 DIAGNOSIS — R102 Pelvic and perineal pain unspecified side: Secondary | ICD-10-CM

## 2024-04-11 DIAGNOSIS — N50819 Testicular pain, unspecified: Secondary | ICD-10-CM

## 2024-04-11 LAB — URINALYSIS, MACROSCOPIC
BILIRUBIN: NEGATIVE mg/dL
BLOOD: NEGATIVE mg/dL
GLUCOSE: NEGATIVE mg/dL
KETONES: NEGATIVE mg/dL
LEUKOCYTES: NEGATIVE WBCs/uL
NITRITE: NEGATIVE
PH: 6 (ref 5.0–9.0)
PROTEIN: NEGATIVE mg/dL
SPECIFIC GRAVITY: 1.023 (ref 1.002–1.030)
UROBILINOGEN: NORMAL mg/dL

## 2024-04-11 LAB — URINALYSIS, MICROSCOPIC
RBCS: 2 /HPF (ref <4)
WBCS: 1 /HPF (ref <6)

## 2024-04-11 NOTE — ED Provider Notes (Signed)
 [place] [facility]  ED Primary Provider Note  Patient Name: Jason Brown  Patient Age: 23 y.o.  Date of Birth: [birth year]    Chief Complaint: Groin Pain              Historical Data   History Reviewed This Encounter: Medical History  Surgical History  Family History  Social History    ED Triage Vitals [04/11/24 1933]   BP (Non-Invasive) (!) 143/87   Heart Rate 86   Respiratory Rate 18   Temperature 36.5 C (97.7 F)   SpO2 99 %   Weight 71.2 kg (157 lb)   Height 1.753 m (5' 9)           History of Present Illness   had concerns including Groin Pain.     Jason Brown is a 23 y.o. male otherwise healthy presents with left lower quadrant abdominal pain radiating to the testicles, describes it as an aching pain and intermittent, that has been ongoing for the past few days.  There was no inciting incident, he does not have any history of this.  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  He has no prior history of kidney stones and no prior history of testicular pain with HSV flares.  He does have a history of hernia repair he is unsure where it was it occurred during infancy.      ROS:  All other review of systems negative unless otherwise stated in HPI.    Physical:    Nursing notes reviewed for what could be assessed. Past Medical, Surgical, and Social history reviewed for what has been completed.     Constitutional:  Nontoxic and well-appearing  HENT:  Head atraumatic, no nasal congestion, moist mucous membranes  Eyes:  EOMI, pupils equal and round, conjunctiva normal.  Neck: Supple  Cardiovascular: Regular Rate and Rhythm with no murmurs rubs or gallops  Pulmonary:  No respiratory distress. Lungs are symmetric to  auscultation bilaterally.  Abdominal: Soft, mild diffuse left lower quadrant abdominal tenderness with no guarding, normal bowel sounds, no palpable masses or hernias, no CVA tenderness,   GU:  Nurse chaperone present in the room, bilateral descended testes with normal lie, cremasteric difficult to elicit bilaterally, there is some mild epididymal tenderness on the left, there is no palpable hernias, no palpable inguinal adenopathy, the overlying scrotum appears unremarkable, the penis is circumcised with no lesions or discharge  MSK:  No deformities or swelling and moving all extremities  Skin: Warm, dry  Neuro:  Alert and oriented, cranial nerves 2-12 grossly intact, no focal motor deficits    MDM:    Vitals reviewed, urinalysis obtained which showed no hematuria or pyuria, scrotal ultrasound was negative for any torsion orchitis epididymitis varicocele or hernia.  Did recommend CT imaging of the abdomen to evaluate for intra-abdominal pathology and patient declined any further testing is concerned about insurance, I think based on his symptoms my most likely diagnoses would be diverticulitis or urolithiasis however the differential for abdominal pain is broad including hernia, appendicitis, bowel perforation among others which are unlikely in his case based on his history and exam.  I did have shared decision-making regarding further testings and patient would like to watch  and wait and return if his symptoms worsen which I think is reasonable as well, discussed signs and symptoms for return.            Procedures      Patient Data     Labs Ordered/Reviewed   URINALYSIS, MACROSCOPIC - Normal   URINALYSIS, MICROSCOPIC - Normal   URINALYSIS, MACROSCOPIC AND MICROSCOPIC W/CULTURE REFLEX    Narrative:     The following orders were created for panel order URINALYSIS, MACROSCOPIC AND MICROSCOPIC W/CULTURE REFLEX [PRN ONLY].  Procedure                               Abnormality         Status                      ---------                               -----------         ------                     URINALYSIS, MACROSCOPIC[[id number]]      Normal              Final result               URINALYSIS, MICROSCOPIC[[id number]]      Normal              Final result                 Please view results for these tests on the individual orders.       US  SCROTUM   Final Result by Edi, Radresults In (10/10 2100)   UNREMARKABLE EXAM WITHOUT ANY FINDING TO EXPLAIN THE PATIENT'S SYMPTOMS.         Radiologist location ID: [id number]                        Decision for and Complexity of Risk in the ED encounter:  I ordered prescription medications requiring authorization in the ED or at discharge.                                  Following the history, physical exam, and ED workup, the patient was deemed stable and suitable for discharge. The patient/caregiver was advised to return to the ED for any new or worsening symptoms. Discharge medications, and follow-up instructions were discussed with the patient/caregiver in detail, who verbalizes understanding. The patient/caregiver is in agreement and is comfortable with the plan of care.    Disposition: Discharged         Current Discharge Medication List        CONTINUE these medications - NO CHANGES were made during your visit.        Details   fluticasone propionate 50 mcg/actuation Spray, Suspension  Commonly known as: FLONASE   1 Spray, Daily  Refills: 0     * Ibuprofen  800 mg Tablet  Commonly known as: MOTRIN    800 mg, Oral, 4 TIMES DAILY PRN  Qty: 30 Tablet  Refills: 0     * Ibuprofen  600 mg Tablet  Commonly known as: MOTRIN    600 mg, Oral, 4 TIMES DAILY PRN  Qty:  30 Tablet  Refills: 0     loratadine 10 mg Tablet  Commonly known as: CLARITIN   10 mg, Daily  Refills: 0           * This list has 2 medication(s) that are the same as other medications prescribed for you. Read the directions carefully, and ask your doctor or other care provider to review them with you.                Follow  up:   Shiela Darice BRAVO, FNP-C  [address]  [place] [address]  [phone]                Risk as noted in the medical decision-making is in reference to potential morbidity/mortality of management based upon previously established billing guidelines.    Based upon the clinical setting, the likely diagnosis/impression include:    Clinical Impression   Pain in testicle, unspecified laterality (Primary)   Left lower quadrant abdominal pain         Discharge Medication List as of 04/11/2024 10:16 PM                This note was partially created using voice recognition software and is inherently subject to errors including those of syntax and sound alike  substitutions which may escape proof reading. In such instances, original meaning may be extrapolated by contextual derivation.

## 2024-04-11 NOTE — ED Nurses Note (Addendum)
 Patient into ED22 with complaints of testicular pain since Tuesday, 6/10. US  negative for torsion. Patient denies SOB, difficulty breathing, chest pain at this time. Patient placed on BP and O2 at this time. Lung sounds clear bilaterally, 98% on RA. S1, S2 heard upon auscultation. Patient denies further needs or concerns at this time. Call light within reach.

## 2024-04-11 NOTE — ED APP Handoff Note (Signed)
 [facility] - Emergency Department  Emergency Department  Provider in Triage Note    Name: Jason Brown  Age: 23 y.o.  Gender: male     Subjective:   Jason Brown is a 23 y.o. male who presents with complaint of Groin Pain  .  Pt present with complaints of perineal pain that has been present x 2 days ago that has been progressive. He denies any trauma, dysuria, testicular pain. 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 He took Ibuprofen  600 mg which offered temporary relief.     Objective:   Filed Vitals:    04/11/24 1933   BP: (!) 143/87   Pulse: 86   Resp: 18   Temp: 36.5 C (97.7 F)   SpO2: 99%      Focused Physical Exam shows adult male alert upright in NAD    Assessment:  A medical screening exam was completed.  This patient is a 23 y.o. male with initial findings showing perineal pain    Plan:  Please see initial orders and work-up below.  This is to be continued with full evaluation in the main Emergency Department.     No current facility-administered medications for this encounter.     Results for orders placed or performed during the hospital encounter of 04/11/24 (from the past 24 hours)   URINALYSIS, MACROSCOPIC AND MICROSCOPIC W/CULTURE REFLEX [PRN ONLY]    Collection Time: 04/11/24  7:36 PM    Specimen: Urine, Clean Catch    Narrative    The following orders were created for panel order URINALYSIS, MACROSCOPIC AND MICROSCOPIC W/CULTURE REFLEX [PRN ONLY].  Procedure                               Abnormality         Status                     ---------                               -----------         ------                     URINALYSIS, MACROSCOPIC[[id number]]                                                     URINALYSIS, MICROSCOPIC[[id number]]                                                       Please view results for these tests on the individual orders.        Jon Eke, PA-C  04/11/2024,  19:32

## 2024-04-11 NOTE — ED Nurses Note (Signed)

## 2024-04-11 NOTE — ED Nurses Note (Signed)
 Patient ambulated out of this ED at this time.

## 2024-04-11 NOTE — Discharge Instructions (Addendum)
 See handouts for details, please follow up with your primary care doctor within the next day or 2 for reassessment, continue drinking fluids and taking 600 mg ibuprofen  every six-to-eight hours for pain as well as Tylenol, if you develop new or worsening symptoms such as fever, worsening pain, inability to have a bowel movement, bloody bowel movements, vomiting, back pain, swelling of your legs and not making urine, or any other symptoms please seek medical attention.

## 2024-04-11 NOTE — ED Triage Notes (Signed)
 Experiencing testicular pain on posterior of testicles that began on Tuesday night.

## 2024-04-12 ENCOUNTER — Other Ambulatory Visit: Payer: Self-pay

## 2024-04-12 ENCOUNTER — Emergency Department
Admission: EM | Admit: 2024-04-12 | Discharge: 2024-04-13 | Disposition: A | Attending: NURSE PRACTITIONER | Admitting: NURSE PRACTITIONER

## 2024-04-12 DIAGNOSIS — R1032 Left lower quadrant pain: Secondary | ICD-10-CM | POA: Insufficient documentation

## 2024-04-12 DIAGNOSIS — N50812 Left testicular pain: Secondary | ICD-10-CM | POA: Insufficient documentation

## 2024-04-12 DIAGNOSIS — R109 Unspecified abdominal pain: Secondary | ICD-10-CM

## 2024-04-12 LAB — CBC WITH DIFF
BASOPHIL #: 0 x10ˆ3/uL (ref 0.00–0.10)
BASOPHIL %: 0 % (ref 0–1)
EOSINOPHIL #: 0.2 x10ˆ3/uL (ref 0.00–0.60)
EOSINOPHIL %: 2 % (ref 1–8)
HCT: 45.8 % (ref 36.7–47.1)
HGB: 15.6 g/dL (ref 12.5–16.3)
LYMPHOCYTE #: 3.7 x10ˆ3/uL — ABNORMAL HIGH (ref 1.00–3.00)
LYMPHOCYTE %: 44 % — ABNORMAL HIGH (ref 15–43)
MCH: 28.1 pg (ref 23.8–33.4)
MCHC: 34 g/dL (ref 32.5–36.3)
MCV: 82.6 fL (ref 73.0–96.2)
MONOCYTE #: 0.5 x10ˆ3/uL (ref 0.30–1.10)
MONOCYTE %: 6 % (ref 6–14)
MPV: 8.7 fL (ref 7.4–11.4)
NEUTROPHIL #: 4 x10ˆ3/uL (ref 1.85–7.84)
NEUTROPHIL %: 47 % (ref 44–74)
PLATELETS: 247 x10ˆ3/uL (ref 140–440)
RBC: 5.55 x10ˆ6/uL (ref 4.06–5.63)
RDW: 12.9 % (ref 12.1–16.2)
WBC: 8.5 x10ˆ3/uL (ref 3.6–10.2)

## 2024-04-12 LAB — COMPREHENSIVE METABOLIC PANEL, NON-FASTING
ALBUMIN/GLOBULIN RATIO: 2.6 — ABNORMAL HIGH (ref 0.8–1.4)
ALBUMIN: 4.9 g/dL (ref 3.5–5.7)
ALKALINE PHOSPHATASE: 57 U/L (ref 34–104)
ALT (SGPT): 17 U/L (ref 7–52)
ANION GAP: 8 mmol/L (ref 4–13)
AST (SGOT): 13 U/L (ref 13–39)
BILIRUBIN TOTAL: 0.6 mg/dL (ref 0.3–1.0)
BUN/CREA RATIO: 15 (ref 6–22)
BUN: 13 mg/dL (ref 7–25)
CALCIUM, CORRECTED: 8.6 mg/dL — ABNORMAL LOW (ref 8.9–10.8)
CALCIUM: 9.3 mg/dL (ref 8.6–10.3)
CHLORIDE: 104 mmol/L (ref 98–107)
CO2 TOTAL: 24 mmol/L (ref 21–31)
CREATININE: 0.88 mg/dL (ref 0.60–1.30)
ESTIMATED GFR: 125 mL/min/1.73mˆ2 (ref >59)
GLOBULIN: 1.9 — ABNORMAL LOW (ref 2.0–3.5)
GLUCOSE: 93 mg/dL (ref 74–109)
OSMOLALITY, CALCULATED: 272 mosm/kg (ref 270–290)
POTASSIUM: 3.9 mmol/L (ref 3.5–5.1)
PROTEIN TOTAL: 6.8 g/dL (ref 6.4–8.9)
SODIUM: 136 mmol/L (ref 136–145)

## 2024-04-12 LAB — LIPASE: LIPASE: 18 U/L (ref 11–82)

## 2024-04-12 MED ORDER — SODIUM CHLORIDE 0.9 % (FLUSH) INJECTION SYRINGE
3.0000 mL | INJECTION | INTRAMUSCULAR | Status: DC | PRN
Start: 2024-04-12 — End: 2024-04-13

## 2024-04-12 MED ORDER — SODIUM CHLORIDE 0.9 % (FLUSH) INJECTION SYRINGE
3.0000 mL | INJECTION | Freq: Three times a day (TID) | INTRAMUSCULAR | Status: DC
Start: 2024-04-12 — End: 2024-04-13
  Administered 2024-04-12: 0 mL

## 2024-04-12 NOTE — ED Triage Notes (Signed)
 Increased lower left abdominal and groin pain since yesterdays ER visit. Seen by Dr. Rodgers yesterday and told to come back if pain worsened.

## 2024-04-12 NOTE — ED APP Handoff Note (Signed)
 [facility] - Emergency Department  Emergency Department  Provider in Triage Note    Name: Jason Brown  Age: 23 y.o.  Gender: male     Subjective:   Jason Brown is a 23 y.o. male who presents with complaint of Abdominal Pain  .  Pt reports worsening LLQ pain, swelling in the left groin, and constipation  since being evaluated yesterday.     Objective:   Filed Vitals:    04/12/24 2147   BP: 138/85   Pulse: 72   Resp: 19   Temp: 37.1 C (98.7 F)   SpO2: 98%      Focused Physical Exam shows WNWD male pt.     Assessment:  A medical screening exam was completed.  This patient is a 23 y.o. male with initial findings showing LLQ pain    Plan:  Please see initial orders and work-up below.  This is to be continued with full evaluation in the main Emergency Department.     No current facility-administered medications for this encounter.     No results found for this or any previous visit (from the past 24 hours).     Mears, PA-C  04/12/2024, 21:47

## 2024-04-12 NOTE — ED Nurses Note (Signed)
 Pt to ED20 with c/o lower abdominal/scrotal pain. Pt was seen in ER yesterday for same issue but states the pain is more persistent and is no longer helped by ibuprofen . Pt placed on BP and pulse ox monitors. Call light within reach. No further needs identified at this time.

## 2024-04-13 ENCOUNTER — Encounter (HOSPITAL_COMMUNITY): Payer: Self-pay | Admitting: NURSE PRACTITIONER

## 2024-04-13 ENCOUNTER — Emergency Department (HOSPITAL_COMMUNITY)

## 2024-04-13 DIAGNOSIS — R109 Unspecified abdominal pain: Secondary | ICD-10-CM

## 2024-04-13 LAB — URINALYSIS, MICROSCOPIC
RBCS: <1 /HPF (ref <4)
SQUAMOUS EPITHELIAL: <1 /HPF (ref <28)

## 2024-04-13 LAB — URINALYSIS, MACROSCOPIC
BILIRUBIN: NEGATIVE mg/dL
BLOOD: NEGATIVE mg/dL
GLUCOSE: NEGATIVE mg/dL
KETONES: NEGATIVE mg/dL
LEUKOCYTES: NEGATIVE WBCs/uL
NITRITE: NEGATIVE
PH: 6 (ref 5.0–9.0)
PROTEIN: 10 mg/dL
SPECIFIC GRAVITY: 1.031 — ABNORMAL HIGH (ref 1.002–1.030)
UROBILINOGEN: NORMAL mg/dL

## 2024-04-13 MED ORDER — SODIUM CHLORIDE 0.9 % IV BOLUS
1000.0000 mL | INJECTION | Status: AC
Start: 2024-04-13 — End: 2024-04-13
  Administered 2024-04-13: 0 mL via INTRAVENOUS
  Administered 2024-04-13: 1000 mL via INTRAVENOUS

## 2024-04-13 MED ORDER — IOPAMIDOL 370 MG IODINE/ML (76 %) INTRAVENOUS SOLUTION
75.0000 mL | INTRAVENOUS | Status: AC
Start: 2024-04-13 — End: 2024-04-13
  Administered 2024-04-13: 75 mL via INTRAVENOUS

## 2024-04-13 MED ORDER — KETOROLAC 30 MG/ML (1 ML) INJECTION SOLUTION
INTRAMUSCULAR | Status: AC
Start: 2024-04-13 — End: 2024-04-13
  Filled 2024-04-13: qty 1

## 2024-04-13 MED ORDER — KETOROLAC 30 MG/ML (1 ML) INJECTION SOLUTION
30.0000 mg | INTRAMUSCULAR | Status: AC
Start: 2024-04-13 — End: 2024-04-13
  Administered 2024-04-13: 30 mg via INTRAVENOUS

## 2024-04-13 MED ORDER — TAMSULOSIN 0.4 MG CAPSULE
0.4000 mg | ORAL_CAPSULE | Freq: Every evening | ORAL | 0 refills | Status: AC
Start: 2024-04-13 — End: 2024-05-13

## 2024-04-13 MED ORDER — KETOROLAC 10 MG TABLET
10.0000 mg | ORAL_TABLET | Freq: Four times a day (QID) | ORAL | 0 refills | Status: AC | PRN
Start: 2024-04-13 — End: 2024-04-16

## 2024-04-13 NOTE — ED Provider Notes (Signed)
 [facility]  ED Primary Provider Note  Patient Name: Jason Brown  Patient Age: 23 y.o.  Date of Birth: Jul 28, 2000    Chief Complaint: Abdominal Pain        History of Present Illness       Kahner Yanik is a 23 y.o. male who had concerns including Abdominal Pain.     History of Present Illness  Spencer Cardinal is a 23 year old male who presents with left-sided testicular pain.    He has been experiencing left-sided testicular pain since Tuesday night, which has gradually worsened. The pain is intermittent, described as 'sometimes I'm good, sometimes I'm not,' and has been more severe today compared to previous days. He rates the pain as a 3 out of 10 at the time of the visit, noting that it has decreased from earlier.    The pain is predominantly on the left side. No blood in urine or burning sensation during urination. He has no history of kidney stones.    An ultrasound performed last night was normal regarding his testicles.       Review of Systems     No other overt Review of Systems are noted to be positive except noted in the HPI.      Historical Data   History Reviewed This Encounter: Medical History  Surgical History  Family History  Social History      Physical Exam   ED Triage Vitals [04/12/24 2147]   BP (Non-Invasive) 138/85   Heart Rate 72   Respiratory Rate 19   Temperature 37.1 C (98.7 F)   SpO2 98 %   Weight    Height          Nursing notes reviewed for what could be assessed. Past Medical, Surgical, and Social history reviewed for what has been completed.     Constitutional: NAD. Well-Developed. Well Nourished.  Head: Normocephalic, atraumatic.  Mouth/Throat:  Moist mucous membranes.  Eyes: EOM grossly intact, conjunctiva normal.  Neck: Supple  Cardiovascular: Regular Rate and Rhythm, extremities well perfused.  Pulmonary/Chest: No respiratory distress. Lungs are symmetric to auscultation bilaterally.    Abdominal: Soft, non-tender, non-distended. Non  peritoneal, no rebound, no guarding.  Left-sided CVA tenderness  MSK: No Lower Extremity Edema.  Skin: Warm, dry, and intact  Neuro: Appropriate, CN II-XII grossly intact. Gait at patient's baseline  Psych: Pleasant             Procedures      Patient Data     Labs Ordered/Reviewed   COMPREHENSIVE METABOLIC PANEL, NON-FASTING - Abnormal; Notable for the following components:       Result Value    ALBUMIN/GLOBULIN RATIO 2.6 (*)     CALCIUM, CORRECTED 8.6 (*)     GLOBULIN 1.9 (*)     All other components within normal limits    Narrative:     Estimated Glomerular Filtration Rate (eGFR) is calculated using the CKD-EPI (2021) equation, intended for patients 73 years of age and older. If gender is not documented or unknown, there will be no eGFR calculation.     CBC WITH DIFF - Abnormal; Notable for the following components:    LYMPHOCYTE % 44 (*)     LYMPHOCYTE # 3.70 (*)     All other components within normal limits   URINALYSIS, MACROSCOPIC - Abnormal; Notable for the following components:    SPECIFIC GRAVITY 1.031 (*)     All other components within normal limits  LIPASE - Normal   CBC/DIFF    Narrative:     The following orders were created for panel order CBC/DIFF.  Procedure                               Abnormality         Status                     ---------                               -----------         ------                     CBC WITH DIFF[[id number]]                Abnormal            Final result                 Please view results for these tests on the individual orders.   EXTRA TUBES    Narrative:     The following orders were created for panel order EXTRA TUBES.  Procedure                               Abnormality         Status                     ---------                               -----------         ------                     BLUE TOP ULAZ[[id number]]                                    Final result               GOLD TOP ULAZ[[id number]]                                    Final result                GRAY TOP ULAZ[[id number]]                                    Final result                 Please view results for these tests on the individual orders.   BLUE TOP TUBE   GOLD TOP TUBE   GRAY TOP TUBE   URINALYSIS, MACROSCOPIC AND MICROSCOPIC W/CULTURE REFLEX    Narrative:     The following orders were created for panel order URINALYSIS, MACROSCOPIC AND MICROSCOPIC W/CULTURE REFLEX [PRN ONLY].  Procedure  Abnormality         Status                     ---------                               -----------         ------                     URINALYSIS, MACROSCOPIC[[id number]]      Abnormal            Final result               URINALYSIS, MICROSCOPIC[[id number]]                          Final result                 Please view results for these tests on the individual orders.   URINALYSIS, MICROSCOPIC       CT ABDOMEN PELVIS W IV CONTRAST   Final Result by Edi, Radresults In (10/12 0302)   No acute abnormalities in the abdomen or pelvis.         Radiologist location ID: [facility]             Medical Decision Making          Medical Decision Making  Amount and/or Complexity of Data Reviewed  Radiology: ordered. Decision-making details documented in ED Course.  ECG/medicine tests: independent interpretation performed.    Risk  Prescription drug management.          Studies Assessed:  Labs, imaging      MDM Narrative:      Medical Decision Making  23 year old male presented with intermittent left testicular and flank pain radiating to the back of the testicle since Tuesday night, worsening today, with no hematuria or dysuria. Pain was more pronounced on the left side and improved with Toradol. Physical exam revealed left-sided tenderness, and labs were unremarkable. CT with IV contrast showed no acute abnormalities or renal calculi.    Differential diagnosis includes, but is not limited to:  - Nephrolithiasis: Considered due to left flank and testicular pain radiating to the back of the  testicle, but CT imaging and urinalysis were negative for renal calculi and hematuria.  - Musculoskeletal pain: Considered as an alternative etiology given the absence of urinary findings and negative imaging, and pain improved with NSAIDs.    Left testicular and flank pain (possible nephrolithiasis vs musculoskeletal pain)  - Administered Toradol for pain control  - Ordered urinalysis  - Ordered CT with IV contrast  - Instructed patient to follow up with PCP  - Advised patient to return to ED if symptoms worsen  - Discharged patient                      ED Course as of 04/13/24 0313   Sat Apr 12, 2024   2253 LIPASE: 18   Sun Apr 13, 2024   0303 CT ABDOMEN PELVIS W IV CONTRAST  IMPRESSION:  No acute abnormalities in the abdomen or pelvis.     0310 US  SCROTUM (04/11/24 2100)  IMPRESSION:  UNREMARKABLE EXAM WITHOUT ANY FINDING TO EXPLAIN THE PATIENT'S SYMPTOMS.           Medications Ordered/Administered in the ED   NS flush  syringe (0 mL Intracatheter Not Given 04/12/24 2330)   NS flush syringe (has no administration in time range)   ketorolac (TORADOL) 30 mg/mL injection (30 mg Intravenous Given 04/13/24 0055)   NS bolus infusion 1,000 mL (0 mL Intravenous Stopped 04/13/24 0152)   iopamidol (ISOVUE-370) 76% infusion (75 mL Intravenous Given 04/13/24 0256)       Following the history, physical exam, and ED workup, the patient was deemed stable and suitable for discharge. The patient/caregiver was advised to return to the ED for any new or worsening symptoms. Discharge medications, and follow-up instructions were discussed with the patient/caregiver in detail, who verbalizes understanding. The patient/caregiver is in agreement and is comfortable with the plan of care.    Disposition: Discharged         Current Discharge Medication List        START taking these medications.        Details   ketorolac tromethamine 10 mg Tablet  Commonly known as: TORADOL   10 mg, Oral, EVERY 6 HOURS PRN  Qty: 12 Tablet  Refills: 0      tamsulosin 0.4 mg Capsule  Commonly known as: FLOMAX   0.4 mg, Oral, EVERY EVENING AFTER DINNER  Qty: 30 Capsule  Refills: 0            CONTINUE these medications - NO CHANGES were made during your visit.        Details   fluticasone propionate 50 mcg/actuation Spray, Suspension  Commonly known as: FLONASE   1 Spray, Daily  Refills: 0     * Ibuprofen  800 mg Tablet  Commonly known as: MOTRIN    800 mg, Oral, 4 TIMES DAILY PRN  Qty: 30 Tablet  Refills: 0     * Ibuprofen  600 mg Tablet  Commonly known as: MOTRIN    600 mg, Oral, 4 TIMES DAILY PRN  Qty: 30 Tablet  Refills: 0     loratadine 10 mg Tablet  Commonly known as: CLARITIN   10 mg, Daily  Refills: 0           * This list has 2 medication(s) that are the same as other medications prescribed for you. Read the directions carefully, and ask your doctor or other care provider to review them with you.                Follow up:   No follow-up provider specified.            Clinical Impression   Abdominal pain, unspecified abdominal location (Primary)         Current Discharge Medication List        START taking these medications    Details   ketorolac tromethamine (TORADOL) 10 mg Oral Tablet Take 1 Tablet (10 mg total) by mouth Every 6 hours as needed for Pain for up to 3 days  Qty: 12 Tablet, Refills: 0      tamsulosin (FLOMAX) 0.4 mg Oral Capsule Take 1 Capsule (0.4 mg total) by mouth Every evening after dinner  Qty: 30 Capsule, Refills: 0                 PSABRA Elbe FNP-C  Department of Emergency Medicine

## 2024-04-13 NOTE — ED Nurses Note (Signed)

## 2024-04-13 NOTE — Discharge Instructions (Signed)
 VISIT SUMMARY:  You came in today because you have been experiencing left-sided testicular pain since Tuesday night, which has been getting worse. The pain has been intermittent and more severe today, but it has decreased by the time of your visit. An ultrasound of your testicles was normal, and a CT scan showed no acute abnormalities or kidney stones.    YOUR PLAN:  -LEFT TESTICULAR AND FLANK PAIN, POSSIBLE NEPHROLITHIASIS VERSUS MUSCULOSKELETAL PAIN: Your pain could be due to either kidney stones or a muscle-related issue. We will check your urine for any signs of blood to help determine the cause. You should follow up with your primary care doctor and return to the emergency department if your symptoms get worse.    INSTRUCTIONS:  Please follow up with your primary care doctor. Return to the emergency department if your symptoms worsen.       Contains text generated by [employer]bridge

## 2024-06-28 ENCOUNTER — Other Ambulatory Visit: Payer: Self-pay

## 2024-06-28 ENCOUNTER — Encounter (HOSPITAL_COMMUNITY): Payer: Self-pay

## 2024-06-28 ENCOUNTER — Emergency Department
Admission: EM | Admit: 2024-06-28 | Discharge: 2024-06-28 | Disposition: A | Discharge status: Home / Self Care | Payer: Self-pay | Attending: Physician Assistant | Admitting: Physician Assistant

## 2024-06-28 DIAGNOSIS — K2971 Gastritis, unspecified, with bleeding: Secondary | ICD-10-CM

## 2024-06-28 DIAGNOSIS — R10816 Epigastric abdominal tenderness: Secondary | ICD-10-CM | POA: Insufficient documentation

## 2024-06-28 DIAGNOSIS — K2970 Gastritis, unspecified, without bleeding: Secondary | ICD-10-CM | POA: Insufficient documentation

## 2024-06-28 LAB — URINALYSIS, MACROSCOPIC
BILIRUBIN: NEGATIVE mg/dL
BLOOD: NEGATIVE mg/dL
GLUCOSE: NEGATIVE mg/dL
KETONES: 20 mg/dL — AB
LEUKOCYTES: NEGATIVE WBCs/uL
NITRITE: NEGATIVE
PH: 6 (ref 5.0–9.0)
PROTEIN: NEGATIVE mg/dL
SPECIFIC GRAVITY: 1.02 (ref 1.002–1.030)
UROBILINOGEN: NORMAL mg/dL

## 2024-06-28 LAB — CBC WITH DIFF
BASOPHIL #: 0 x10ˆ3/uL (ref 0.00–0.10)
BASOPHIL %: 1 % (ref 0–1)
EOSINOPHIL #: 0.2 x10ˆ3/uL (ref 0.00–0.60)
EOSINOPHIL %: 4 % (ref 1–8)
HCT: 49.3 % — ABNORMAL HIGH (ref 36.7–47.1)
HGB: 17.3 g/dL — ABNORMAL HIGH (ref 12.5–16.3)
LYMPHOCYTE #: 2.4 x10ˆ3/uL (ref 1.00–3.00)
LYMPHOCYTE %: 37 % (ref 15–43)
MCH: 28.9 pg (ref 23.8–33.4)
MCHC: 35.2 g/dL (ref 32.5–36.3)
MCV: 82.2 fL (ref 73.0–96.2)
MONOCYTE #: 0.5 x10ˆ3/uL (ref 0.30–1.10)
MONOCYTE %: 8 % (ref 6–14)
MPV: 8.7 fL (ref 7.4–11.4)
NEUTROPHIL #: 3.4 x10ˆ3/uL (ref 1.85–7.84)
NEUTROPHIL %: 52 % (ref 44–74)
PLATELETS: 253 x10ˆ3/uL (ref 140–440)
RBC: 5.99 x10ˆ6/uL — ABNORMAL HIGH (ref 4.06–5.63)
RDW: 13.1 % (ref 12.1–16.2)
WBC: 6.6 x10ˆ3/uL (ref 3.6–10.2)

## 2024-06-28 LAB — COMPREHENSIVE METABOLIC PANEL, NON-FASTING
ALBUMIN/GLOBULIN RATIO: 2 — ABNORMAL HIGH (ref 0.8–1.4)
ALBUMIN: 5.1 g/dL (ref 3.5–5.7)
ALKALINE PHOSPHATASE: 60 U/L (ref 34–104)
ALT (SGPT): 14 U/L (ref 7–52)
ANION GAP: 9 mmol/L (ref 4–13)
AST (SGOT): 13 U/L (ref 13–39)
BILIRUBIN TOTAL: 0.7 mg/dL (ref 0.3–1.0)
BUN/CREA RATIO: 12 (ref 6–22)
BUN: 13 mg/dL (ref 7–25)
CALCIUM, CORRECTED: 8.7 mg/dL — ABNORMAL LOW (ref 8.9–10.8)
CALCIUM: 9.6 mg/dL (ref 8.6–10.3)
CHLORIDE: 103 mmol/L (ref 98–107)
CO2 TOTAL: 26 mmol/L (ref 21–31)
CREATININE: 1.08 mg/dL (ref 0.60–1.30)
ESTIMATED GFR: 100 mL/min/1.73mˆ2 (ref >59)
GLOBULIN: 2.5 (ref 2.0–3.5)
GLUCOSE: 92 mg/dL (ref 74–109)
OSMOLALITY, CALCULATED: 276 mosm/kg (ref 270–290)
POTASSIUM: 4.6 mmol/L (ref 3.5–5.1)
PROTEIN TOTAL: 7.6 g/dL (ref 6.4–8.9)
SODIUM: 138 mmol/L (ref 136–145)

## 2024-06-28 LAB — URINALYSIS, MICROSCOPIC
RBCS: <1 /HPF (ref <4)
WBCS: <1 /HPF (ref <6)

## 2024-06-28 LAB — LIPASE: LIPASE: 16 U/L (ref 11–82)

## 2024-06-28 LAB — C-REACTIVE PROTEIN (CRP): C-REACTIVE PROTEIN (CRP): <0.5 mg/dL (ref 0.1–0.5)

## 2024-06-28 MED ORDER — LIDOCAINE HCL 2 % MUCOSAL SOLUTION
Status: AC
  Filled 2024-06-28: qty 30

## 2024-06-28 MED ORDER — HYOSCYAMINE 0.125 MG/5 ML ORAL ELIXIR
10.0000 mL | ORAL_SOLUTION | Freq: Once | ORAL | Status: AC
  Administered 2024-06-28: 10 mL via ORAL

## 2024-06-28 MED ORDER — LIDOCAINE HCL 2 % MUCOSAL SOLUTION
15.0000 mL | Freq: Once | Status: AC
  Administered 2024-06-28: 15 mL via ORAL

## 2024-06-28 MED ORDER — ALUMINUM-MAG HYDROXIDE-SIMETHICONE 200 MG-200 MG-20 MG/5 ML ORAL SUSP
ORAL | Status: AC
  Filled 2024-06-28: qty 30

## 2024-06-28 MED ORDER — ALUMINUM-MAG HYDROXIDE-SIMETHICONE 200 MG-200 MG-20 MG/5 ML ORAL SUSP
30.0000 mL | Freq: Once | ORAL | Status: AC
  Administered 2024-06-28: 30 mL via ORAL

## 2024-06-28 MED ORDER — OMEPRAZOLE 40 MG CAPSULE,DELAYED RELEASE: 40.0000 mg | DELAYED_RELEASE_CAPSULE | Freq: Every day | ORAL | 4 refills | Status: AC

## 2024-06-28 MED ORDER — FAMOTIDINE 20 MG TABLET: 20.0000 mg | ORAL_TABLET | Freq: Two times a day (BID) | ORAL | 1 refills | Status: AC

## 2024-06-28 NOTE — ED Nurses Note (Signed)
 Patient states that he is pain free at this time. Provider aware.

## 2024-06-28 NOTE — Discharge Instructions (Signed)
 A new prescription for omeprazole  was sent to the pharmacy for 40 mg a day take this as prescribed an additional prescription was sent for famotidine  take this as prescribed avoid exacerbating foods or NSAIDs follow up with the primary care to make sure getting better.  Included in this packet contact information for General surgery or Gastroenterology if you have no improvement over the next couple of weeks she would likely need endoscopy.  If new or worsening symptoms return to the emergency department

## 2024-06-28 NOTE — ED Provider Notes (Signed)
 [facility] - Emergency Department  ED Primary Note  History of Present Illness   Donald Memoli is a 23 y.o. male who had concerns including Abdominal Pain.     Patient is a 23 year old male no significant past medical history presents emergency department with complaints of epigastric pain.  He states he has early satiety has burning feels like indigestion he saw primary care a week ago and was started on 20 mg of omeprazole  which he has been compliant with.  No previous surgeries no fevers no shortness of breath    Physical Exam   ED Triage Vitals [06/28/24 1336]   BP (Non-Invasive) (!) 140/87   Heart Rate 86   Respiratory Rate 17   Temperature 36 C (96.8 F)   SpO2 99 %   Weight 71.7 kg (158 lb)   Height 1.753 m (5' 9)     Physical Exam  Constitutional:       Appearance: Normal appearance.   HENT:      Head: Normocephalic.      Nose: Nose normal.      Mouth/Throat:      Mouth: Mucous membranes are moist.   Eyes:      Extraocular Movements: Extraocular movements intact.      Pupils: Pupils are equal, round, and reactive to light.   Cardiovascular:      Rate and Rhythm: Normal rate and regular rhythm.      Pulses: Normal pulses.      Heart sounds: Normal heart sounds.   Pulmonary:      Effort: Pulmonary effort is normal.      Breath sounds: Normal breath sounds.   Abdominal:      General: Abdomen is flat.      Palpations: Abdomen is soft.      Comments: Mild epigastric tenderness to palpation no rebound or guarding   Musculoskeletal:      Cervical back: Normal range of motion.   Neurological:      Mental Status: He is alert.       Patient Data   Labs Ordered/Reviewed   COMPREHENSIVE METABOLIC PANEL, NON-FASTING - Abnormal; Notable for the following components:       Result Value    ALBUMIN/GLOBULIN RATIO 2.0 (*)     CALCIUM, CORRECTED 8.7 (*)     All other components within normal limits    Narrative:     Estimated Glomerular Filtration Rate (eGFR) is calculated using the CKD-EPI (2021)  equation, intended for patients 54 years of age and older. If gender is not documented or unknown, there will be no eGFR calculation.     CBC WITH DIFF - Abnormal; Notable for the following components:    RBC 5.99 (*)     HGB 17.3 (*)     HCT 49.3 (*)     All other components within normal limits   URINALYSIS, MACROSCOPIC - Abnormal; Notable for the following components:    KETONES 20 (*)     All other components within normal limits   URINALYSIS, MICROSCOPIC - Abnormal; Notable for the following components:    AMORPHOUS SEDIMENT Rare (*)     All other components within normal limits   C-REACTIVE PROTEIN (CRP) - Normal   LIPASE - Normal   CBC/DIFF    Narrative:     The following orders were created for panel order CBC/DIFF.  Procedure  Abnormality         Status                     ---------                               -----------         ------                     CBC WITH IPQQ[[id number]]                Abnormal            Final result                 Please view results for these tests on the individual orders.   URINALYSIS, MACROSCOPIC AND MICROSCOPIC W/CULTURE REFLEX    Narrative:     The following orders were created for panel order URINALYSIS, MACROSCOPIC AND MICROSCOPIC W/CULTURE REFLEX.  Procedure                               Abnormality         Status                     ---------                               -----------         ------                     URINALYSIS, MACROSCOPIC[[id number]]      Abnormal            Final result               URINALYSIS, MICROSCOPIC[[id number]]      Abnormal            Final result                 Please view results for these tests on the individual orders.   EXTRA TUBES    Narrative:     The following orders were created for panel order EXTRA TUBES.  Procedure                               Abnormality         Status                     ---------                               -----------         ------                     PARIS TOP  ULAZ[[id number]]                                    Final result               GOLD TOP ULAZ[[id number]]  Final result               LIGHT GREEN TOP ULAZ[[id number]]                             Final result               GRAY TOP ULAZ[[id number]]                                    Final result                 Please view results for these tests on the individual orders.   BLUE TOP TUBE   GOLD TOP TUBE   LIGHT GREEN TOP TUBE   GRAY TOP TUBE     No orders to display     Medical Decision Making        Medical Decision Making  Blood work largely unremarkable with the exception of some hemoconcentration with a elevated hemoglobin.  Patient given GI cocktail with resolution of symptoms.  We will increase his omeprazole  to 40 mg lab famotidine  b.i.d..  He is given contact information for General surgery and Gastroenterology if he has no improvement over the next couple of weeks he would like he needs endoscopy this was discussed and explained to patient he agrees with the plan.  We discussed return precautions    Risk  OTC drugs.  Prescription drug management.                Medications Ordered/Administered in the ED   aluminum -magnesium hydroxide-simethicone  (MAG-AL PLUS) 200-200-20 mg per 5 mL oral liquid (30 mL Oral Given 06/28/24 1905)     And   hyoscyamine  (HYOSYNE ) 0.125 mg per 5 mL oral liquid (10 mL Oral Given 06/28/24 1905)     And   lidocaine  (XYLOCAINE ) 2% oral topical viscous solution (15 mL Oral Given 06/28/24 1906)     Clinical Impression   Gastritis, presence of bleeding unspecified, unspecified chronicity, unspecified gastritis type (Primary)       Disposition: Discharged

## 2024-06-28 NOTE — ED Nurses Note (Signed)

## 2024-06-28 NOTE — ED Triage Notes (Signed)
 Pt reports that he went to [facility] about a week ago, believes he has a stomach ulcer. Pt reports that he is having abdominal pain in his upper abdomen, feels like indigestion, chest feels full, belches frequently. Pt states that he feels full all the time, reports that he has been trying to eat a bland diet as directed, but states that he has not been able to eat, reports it worsens symptoms.

## 2024-08-07 ENCOUNTER — Encounter (HOSPITAL_COMMUNITY): Payer: Self-pay
